# Patient Record
Sex: Female | Born: 1935 | Race: White | Hispanic: No | Smoking: Never smoker
Health system: Southern US, Community
[De-identification: ages and names within clinical notes are randomized; demographics above are authoritative.]

## PROBLEM LIST (undated history)

## (undated) DIAGNOSIS — R011 Cardiac murmur, unspecified: Secondary | ICD-10-CM

## (undated) DIAGNOSIS — I10 Essential (primary) hypertension: Secondary | ICD-10-CM

## (undated) DIAGNOSIS — N183 Chronic kidney disease, stage 3 unspecified: Secondary | ICD-10-CM

## (undated) DIAGNOSIS — I5032 Chronic diastolic (congestive) heart failure: Secondary | ICD-10-CM

## (undated) DIAGNOSIS — Z952 Presence of prosthetic heart valve: Secondary | ICD-10-CM

## (undated) DIAGNOSIS — Z9289 Personal history of other medical treatment: Secondary | ICD-10-CM

## (undated) DIAGNOSIS — M419 Scoliosis, unspecified: Secondary | ICD-10-CM

## (undated) DIAGNOSIS — M1990 Unspecified osteoarthritis, unspecified site: Secondary | ICD-10-CM

## (undated) DIAGNOSIS — E785 Hyperlipidemia, unspecified: Secondary | ICD-10-CM

## (undated) DIAGNOSIS — I350 Nonrheumatic aortic (valve) stenosis: Secondary | ICD-10-CM

## (undated) HISTORY — DX: Chronic diastolic (congestive) heart failure: I50.32

## (undated) HISTORY — DX: Nonrheumatic aortic (valve) stenosis: I35.0

---

## 1966-06-21 HISTORY — PX: DILATION AND CURETTAGE OF UTERUS: SHX78

## 1998-04-14 ENCOUNTER — Other Ambulatory Visit: Admission: RE | Admit: 1998-04-14 | Discharge: 1998-04-14 | Payer: Self-pay | Admitting: *Deleted

## 1999-05-11 ENCOUNTER — Other Ambulatory Visit: Admission: RE | Admit: 1999-05-11 | Discharge: 1999-05-11 | Payer: Self-pay | Admitting: *Deleted

## 1999-11-23 ENCOUNTER — Encounter: Admission: RE | Admit: 1999-11-23 | Discharge: 1999-11-23 | Payer: Self-pay | Admitting: *Deleted

## 1999-11-23 ENCOUNTER — Encounter: Payer: Self-pay | Admitting: *Deleted

## 2000-05-19 ENCOUNTER — Other Ambulatory Visit: Admission: RE | Admit: 2000-05-19 | Discharge: 2000-05-19 | Payer: Self-pay | Admitting: *Deleted

## 2000-11-25 ENCOUNTER — Encounter: Payer: Self-pay | Admitting: *Deleted

## 2000-11-25 ENCOUNTER — Encounter: Admission: RE | Admit: 2000-11-25 | Discharge: 2000-11-25 | Payer: Self-pay | Admitting: *Deleted

## 2002-01-19 ENCOUNTER — Encounter: Admission: RE | Admit: 2002-01-19 | Discharge: 2002-01-19 | Payer: Self-pay | Admitting: *Deleted

## 2002-01-19 ENCOUNTER — Encounter: Payer: Self-pay | Admitting: *Deleted

## 2002-04-25 ENCOUNTER — Other Ambulatory Visit: Admission: RE | Admit: 2002-04-25 | Discharge: 2002-04-25 | Payer: Self-pay | Admitting: Family Medicine

## 2003-02-08 ENCOUNTER — Encounter: Admission: RE | Admit: 2003-02-08 | Discharge: 2003-02-08 | Payer: Self-pay | Admitting: Family Medicine

## 2003-02-08 ENCOUNTER — Encounter: Payer: Self-pay | Admitting: Family Medicine

## 2003-03-01 ENCOUNTER — Encounter: Admission: RE | Admit: 2003-03-01 | Discharge: 2003-03-01 | Payer: Self-pay | Admitting: Family Medicine

## 2003-03-01 ENCOUNTER — Encounter: Payer: Self-pay | Admitting: Family Medicine

## 2003-05-22 ENCOUNTER — Encounter: Admission: RE | Admit: 2003-05-22 | Discharge: 2003-05-22 | Payer: Self-pay | Admitting: Family Medicine

## 2004-03-12 ENCOUNTER — Encounter: Admission: RE | Admit: 2004-03-12 | Discharge: 2004-03-12 | Payer: Self-pay | Admitting: Family Medicine

## 2005-04-15 ENCOUNTER — Encounter: Admission: RE | Admit: 2005-04-15 | Discharge: 2005-04-15 | Payer: Self-pay | Admitting: Family Medicine

## 2006-01-06 ENCOUNTER — Encounter: Admission: RE | Admit: 2006-01-06 | Discharge: 2006-01-06 | Payer: Self-pay | Admitting: Family Medicine

## 2006-04-20 ENCOUNTER — Encounter: Admission: RE | Admit: 2006-04-20 | Discharge: 2006-04-20 | Payer: Self-pay | Admitting: Family Medicine

## 2007-04-26 ENCOUNTER — Encounter: Admission: RE | Admit: 2007-04-26 | Discharge: 2007-04-26 | Payer: Self-pay | Admitting: Family Medicine

## 2007-07-20 ENCOUNTER — Encounter: Admission: RE | Admit: 2007-07-20 | Discharge: 2007-07-20 | Payer: Self-pay | Admitting: Family Medicine

## 2007-09-03 ENCOUNTER — Emergency Department (HOSPITAL_COMMUNITY): Admission: EM | Admit: 2007-09-03 | Discharge: 2007-09-03 | Payer: Self-pay | Admitting: Emergency Medicine

## 2007-11-08 ENCOUNTER — Encounter: Admission: RE | Admit: 2007-11-08 | Discharge: 2007-11-08 | Payer: Self-pay | Admitting: Orthopedic Surgery

## 2008-04-29 ENCOUNTER — Encounter: Admission: RE | Admit: 2008-04-29 | Discharge: 2008-04-29 | Payer: Self-pay | Admitting: Family Medicine

## 2008-05-03 ENCOUNTER — Encounter: Admission: RE | Admit: 2008-05-03 | Discharge: 2008-05-03 | Payer: Self-pay | Admitting: Family Medicine

## 2009-05-05 ENCOUNTER — Encounter: Admission: RE | Admit: 2009-05-05 | Discharge: 2009-05-05 | Payer: Self-pay | Admitting: Family Medicine

## 2010-07-11 ENCOUNTER — Encounter: Payer: Self-pay | Admitting: Family Medicine

## 2010-07-11 ENCOUNTER — Other Ambulatory Visit: Payer: Self-pay | Admitting: Family Medicine

## 2010-07-11 DIAGNOSIS — Z1239 Encounter for other screening for malignant neoplasm of breast: Secondary | ICD-10-CM

## 2010-07-11 DIAGNOSIS — Z1231 Encounter for screening mammogram for malignant neoplasm of breast: Secondary | ICD-10-CM

## 2010-07-12 ENCOUNTER — Encounter: Payer: Self-pay | Admitting: Family Medicine

## 2010-07-29 ENCOUNTER — Ambulatory Visit
Admission: RE | Admit: 2010-07-29 | Discharge: 2010-07-29 | Disposition: A | Discharge status: Home / Self Care | Payer: Medicare Other | Source: Ambulatory Visit | Attending: Family Medicine | Admitting: Family Medicine

## 2010-07-29 DIAGNOSIS — Z1231 Encounter for screening mammogram for malignant neoplasm of breast: Secondary | ICD-10-CM

## 2011-08-04 ENCOUNTER — Other Ambulatory Visit: Payer: Self-pay | Admitting: Family Medicine

## 2011-08-04 DIAGNOSIS — Z1231 Encounter for screening mammogram for malignant neoplasm of breast: Secondary | ICD-10-CM

## 2011-08-12 ENCOUNTER — Ambulatory Visit
Admission: RE | Admit: 2011-08-12 | Discharge: 2011-08-12 | Disposition: A | Discharge status: Home / Self Care | Payer: BLUE CROSS/BLUE SHIELD | Source: Ambulatory Visit | Attending: Family Medicine | Admitting: Family Medicine

## 2011-08-12 DIAGNOSIS — Z1231 Encounter for screening mammogram for malignant neoplasm of breast: Secondary | ICD-10-CM | POA: Diagnosis not present

## 2011-10-12 DIAGNOSIS — M1990 Unspecified osteoarthritis, unspecified site: Secondary | ICD-10-CM | POA: Diagnosis not present

## 2011-10-12 DIAGNOSIS — E782 Mixed hyperlipidemia: Secondary | ICD-10-CM | POA: Diagnosis not present

## 2011-10-12 DIAGNOSIS — E119 Type 2 diabetes mellitus without complications: Secondary | ICD-10-CM | POA: Diagnosis not present

## 2011-10-12 DIAGNOSIS — I10 Essential (primary) hypertension: Secondary | ICD-10-CM | POA: Diagnosis not present

## 2011-10-12 DIAGNOSIS — I359 Nonrheumatic aortic valve disorder, unspecified: Secondary | ICD-10-CM | POA: Diagnosis not present

## 2011-10-12 DIAGNOSIS — F411 Generalized anxiety disorder: Secondary | ICD-10-CM | POA: Diagnosis not present

## 2012-02-09 DIAGNOSIS — H04129 Dry eye syndrome of unspecified lacrimal gland: Secondary | ICD-10-CM | POA: Diagnosis not present

## 2012-03-14 DIAGNOSIS — Z23 Encounter for immunization: Secondary | ICD-10-CM | POA: Diagnosis not present

## 2012-04-18 DIAGNOSIS — Z1331 Encounter for screening for depression: Secondary | ICD-10-CM | POA: Diagnosis not present

## 2012-04-18 DIAGNOSIS — E782 Mixed hyperlipidemia: Secondary | ICD-10-CM | POA: Diagnosis not present

## 2012-04-18 DIAGNOSIS — I10 Essential (primary) hypertension: Secondary | ICD-10-CM | POA: Diagnosis not present

## 2012-04-18 DIAGNOSIS — E119 Type 2 diabetes mellitus without complications: Secondary | ICD-10-CM | POA: Diagnosis not present

## 2012-04-18 DIAGNOSIS — F411 Generalized anxiety disorder: Secondary | ICD-10-CM | POA: Diagnosis not present

## 2012-04-18 DIAGNOSIS — M1990 Unspecified osteoarthritis, unspecified site: Secondary | ICD-10-CM | POA: Diagnosis not present

## 2012-04-18 DIAGNOSIS — I359 Nonrheumatic aortic valve disorder, unspecified: Secondary | ICD-10-CM | POA: Diagnosis not present

## 2012-09-28 ENCOUNTER — Other Ambulatory Visit: Payer: Self-pay

## 2012-09-28 DIAGNOSIS — Z1231 Encounter for screening mammogram for malignant neoplasm of breast: Secondary | ICD-10-CM

## 2012-10-17 DIAGNOSIS — M899 Disorder of bone, unspecified: Secondary | ICD-10-CM | POA: Diagnosis not present

## 2012-10-17 DIAGNOSIS — F411 Generalized anxiety disorder: Secondary | ICD-10-CM | POA: Diagnosis not present

## 2012-10-17 DIAGNOSIS — I129 Hypertensive chronic kidney disease with stage 1 through stage 4 chronic kidney disease, or unspecified chronic kidney disease: Secondary | ICD-10-CM | POA: Diagnosis not present

## 2012-10-17 DIAGNOSIS — M1990 Unspecified osteoarthritis, unspecified site: Secondary | ICD-10-CM | POA: Diagnosis not present

## 2012-10-17 DIAGNOSIS — I359 Nonrheumatic aortic valve disorder, unspecified: Secondary | ICD-10-CM | POA: Diagnosis not present

## 2012-10-17 DIAGNOSIS — E782 Mixed hyperlipidemia: Secondary | ICD-10-CM | POA: Diagnosis not present

## 2012-10-17 DIAGNOSIS — E1129 Type 2 diabetes mellitus with other diabetic kidney complication: Secondary | ICD-10-CM | POA: Diagnosis not present

## 2012-11-01 ENCOUNTER — Ambulatory Visit: Payer: Medicare Other

## 2012-12-13 ENCOUNTER — Ambulatory Visit
Admission: RE | Admit: 2012-12-13 | Discharge: 2012-12-13 | Disposition: A | Discharge status: Home / Self Care | Payer: Medicare Other | Source: Ambulatory Visit

## 2012-12-13 DIAGNOSIS — Z1231 Encounter for screening mammogram for malignant neoplasm of breast: Secondary | ICD-10-CM | POA: Diagnosis not present

## 2012-12-13 DIAGNOSIS — M949 Disorder of cartilage, unspecified: Secondary | ICD-10-CM | POA: Diagnosis not present

## 2013-02-07 DIAGNOSIS — H5200 Hypermetropia, unspecified eye: Secondary | ICD-10-CM | POA: Diagnosis not present

## 2013-02-07 DIAGNOSIS — H40019 Open angle with borderline findings, low risk, unspecified eye: Secondary | ICD-10-CM | POA: Diagnosis not present

## 2013-02-07 DIAGNOSIS — H353190 Nonexudative age-related macular degeneration, unspecified eye, stage unspecified: Secondary | ICD-10-CM | POA: Diagnosis not present

## 2013-02-07 DIAGNOSIS — H26499 Other secondary cataract, unspecified eye: Secondary | ICD-10-CM | POA: Diagnosis not present

## 2013-02-15 DIAGNOSIS — H26499 Other secondary cataract, unspecified eye: Secondary | ICD-10-CM | POA: Diagnosis not present

## 2013-02-15 DIAGNOSIS — Z961 Presence of intraocular lens: Secondary | ICD-10-CM | POA: Diagnosis not present

## 2013-02-21 DIAGNOSIS — H04129 Dry eye syndrome of unspecified lacrimal gland: Secondary | ICD-10-CM | POA: Diagnosis not present

## 2013-02-21 DIAGNOSIS — H5200 Hypermetropia, unspecified eye: Secondary | ICD-10-CM | POA: Diagnosis not present

## 2013-02-21 DIAGNOSIS — H353190 Nonexudative age-related macular degeneration, unspecified eye, stage unspecified: Secondary | ICD-10-CM | POA: Diagnosis not present

## 2013-03-06 DIAGNOSIS — Z23 Encounter for immunization: Secondary | ICD-10-CM | POA: Diagnosis not present

## 2013-04-18 DIAGNOSIS — I359 Nonrheumatic aortic valve disorder, unspecified: Secondary | ICD-10-CM | POA: Diagnosis not present

## 2013-04-18 DIAGNOSIS — E1129 Type 2 diabetes mellitus with other diabetic kidney complication: Secondary | ICD-10-CM | POA: Diagnosis not present

## 2013-04-18 DIAGNOSIS — E782 Mixed hyperlipidemia: Secondary | ICD-10-CM | POA: Diagnosis not present

## 2013-04-18 DIAGNOSIS — F411 Generalized anxiety disorder: Secondary | ICD-10-CM | POA: Diagnosis not present

## 2013-04-18 DIAGNOSIS — M1990 Unspecified osteoarthritis, unspecified site: Secondary | ICD-10-CM | POA: Diagnosis not present

## 2013-04-18 DIAGNOSIS — I10 Essential (primary) hypertension: Secondary | ICD-10-CM | POA: Diagnosis not present

## 2013-04-18 DIAGNOSIS — Z23 Encounter for immunization: Secondary | ICD-10-CM | POA: Diagnosis not present

## 2014-02-12 ENCOUNTER — Other Ambulatory Visit: Payer: Self-pay

## 2014-02-12 DIAGNOSIS — Z1231 Encounter for screening mammogram for malignant neoplasm of breast: Secondary | ICD-10-CM

## 2014-02-27 ENCOUNTER — Ambulatory Visit
Admission: RE | Admit: 2014-02-27 | Discharge: 2014-02-27 | Disposition: A | Discharge status: Home / Self Care | Payer: Medicare Other | Source: Ambulatory Visit

## 2014-02-27 DIAGNOSIS — Z1231 Encounter for screening mammogram for malignant neoplasm of breast: Secondary | ICD-10-CM | POA: Diagnosis not present

## 2014-03-07 DIAGNOSIS — Z961 Presence of intraocular lens: Secondary | ICD-10-CM | POA: Diagnosis not present

## 2014-03-07 DIAGNOSIS — H52229 Regular astigmatism, unspecified eye: Secondary | ICD-10-CM | POA: Diagnosis not present

## 2014-03-07 DIAGNOSIS — H18419 Arcus senilis, unspecified eye: Secondary | ICD-10-CM | POA: Diagnosis not present

## 2014-03-07 DIAGNOSIS — H11159 Pinguecula, unspecified eye: Secondary | ICD-10-CM | POA: Diagnosis not present

## 2014-03-07 DIAGNOSIS — H5200 Hypermetropia, unspecified eye: Secondary | ICD-10-CM | POA: Diagnosis not present

## 2014-03-07 DIAGNOSIS — H18519 Endothelial corneal dystrophy, unspecified eye: Secondary | ICD-10-CM | POA: Diagnosis not present

## 2014-03-07 DIAGNOSIS — H353190 Nonexudative age-related macular degeneration, unspecified eye, stage unspecified: Secondary | ICD-10-CM | POA: Diagnosis not present

## 2014-03-07 DIAGNOSIS — H04129 Dry eye syndrome of unspecified lacrimal gland: Secondary | ICD-10-CM | POA: Diagnosis not present

## 2014-03-18 DIAGNOSIS — Z23 Encounter for immunization: Secondary | ICD-10-CM | POA: Diagnosis not present

## 2014-04-17 DIAGNOSIS — E1129 Type 2 diabetes mellitus with other diabetic kidney complication: Secondary | ICD-10-CM | POA: Diagnosis not present

## 2014-04-17 DIAGNOSIS — F39 Unspecified mood [affective] disorder: Secondary | ICD-10-CM | POA: Diagnosis not present

## 2014-04-17 DIAGNOSIS — I350 Nonrheumatic aortic (valve) stenosis: Secondary | ICD-10-CM | POA: Diagnosis not present

## 2014-04-17 DIAGNOSIS — I110 Hypertensive heart disease with heart failure: Secondary | ICD-10-CM | POA: Diagnosis not present

## 2014-04-17 DIAGNOSIS — Z1389 Encounter for screening for other disorder: Secondary | ICD-10-CM | POA: Diagnosis not present

## 2014-04-17 DIAGNOSIS — I519 Heart disease, unspecified: Secondary | ICD-10-CM | POA: Diagnosis not present

## 2014-04-17 DIAGNOSIS — I700 Atherosclerosis of aorta: Secondary | ICD-10-CM | POA: Diagnosis not present

## 2014-04-17 DIAGNOSIS — N183 Chronic kidney disease, stage 3 (moderate): Secondary | ICD-10-CM | POA: Diagnosis not present

## 2014-04-17 DIAGNOSIS — M1990 Unspecified osteoarthritis, unspecified site: Secondary | ICD-10-CM | POA: Diagnosis not present

## 2015-02-12 ENCOUNTER — Other Ambulatory Visit: Payer: Self-pay

## 2015-02-12 DIAGNOSIS — Z1231 Encounter for screening mammogram for malignant neoplasm of breast: Secondary | ICD-10-CM

## 2015-02-19 DIAGNOSIS — H1851 Endothelial corneal dystrophy: Secondary | ICD-10-CM | POA: Diagnosis not present

## 2015-02-19 DIAGNOSIS — Z961 Presence of intraocular lens: Secondary | ICD-10-CM | POA: Diagnosis not present

## 2015-02-19 DIAGNOSIS — H3531 Nonexudative age-related macular degeneration: Secondary | ICD-10-CM | POA: Diagnosis not present

## 2015-02-19 DIAGNOSIS — H43812 Vitreous degeneration, left eye: Secondary | ICD-10-CM | POA: Diagnosis not present

## 2015-02-19 DIAGNOSIS — H52223 Regular astigmatism, bilateral: Secondary | ICD-10-CM | POA: Diagnosis not present

## 2015-02-19 DIAGNOSIS — H04123 Dry eye syndrome of bilateral lacrimal glands: Secondary | ICD-10-CM | POA: Diagnosis not present

## 2015-02-19 DIAGNOSIS — H35361 Drusen (degenerative) of macula, right eye: Secondary | ICD-10-CM | POA: Diagnosis not present

## 2015-02-19 DIAGNOSIS — H5203 Hypermetropia, bilateral: Secondary | ICD-10-CM | POA: Diagnosis not present

## 2015-02-19 DIAGNOSIS — H35362 Drusen (degenerative) of macula, left eye: Secondary | ICD-10-CM | POA: Diagnosis not present

## 2015-02-19 DIAGNOSIS — H43811 Vitreous degeneration, right eye: Secondary | ICD-10-CM | POA: Diagnosis not present

## 2015-02-19 DIAGNOSIS — H11153 Pinguecula, bilateral: Secondary | ICD-10-CM | POA: Diagnosis not present

## 2015-02-19 DIAGNOSIS — H18413 Arcus senilis, bilateral: Secondary | ICD-10-CM | POA: Diagnosis not present

## 2015-03-19 ENCOUNTER — Ambulatory Visit
Admission: RE | Admit: 2015-03-19 | Discharge: 2015-03-19 | Disposition: A | Discharge status: Home / Self Care | Payer: Medicare Other | Source: Ambulatory Visit

## 2015-03-19 DIAGNOSIS — Z1231 Encounter for screening mammogram for malignant neoplasm of breast: Secondary | ICD-10-CM

## 2015-03-24 DIAGNOSIS — Z23 Encounter for immunization: Secondary | ICD-10-CM | POA: Diagnosis not present

## 2015-05-07 DIAGNOSIS — M1990 Unspecified osteoarthritis, unspecified site: Secondary | ICD-10-CM | POA: Diagnosis not present

## 2015-05-07 DIAGNOSIS — N183 Chronic kidney disease, stage 3 (moderate): Secondary | ICD-10-CM | POA: Diagnosis not present

## 2015-05-07 DIAGNOSIS — I350 Nonrheumatic aortic (valve) stenosis: Secondary | ICD-10-CM | POA: Diagnosis not present

## 2015-05-07 DIAGNOSIS — M8580 Other specified disorders of bone density and structure, unspecified site: Secondary | ICD-10-CM | POA: Diagnosis not present

## 2015-05-07 DIAGNOSIS — E1129 Type 2 diabetes mellitus with other diabetic kidney complication: Secondary | ICD-10-CM | POA: Diagnosis not present

## 2015-05-07 DIAGNOSIS — I519 Heart disease, unspecified: Secondary | ICD-10-CM | POA: Diagnosis not present

## 2015-05-07 DIAGNOSIS — E782 Mixed hyperlipidemia: Secondary | ICD-10-CM | POA: Diagnosis not present

## 2015-05-07 DIAGNOSIS — M549 Dorsalgia, unspecified: Secondary | ICD-10-CM | POA: Diagnosis not present

## 2015-05-07 DIAGNOSIS — I110 Hypertensive heart disease with heart failure: Secondary | ICD-10-CM | POA: Diagnosis not present

## 2015-05-07 DIAGNOSIS — F39 Unspecified mood [affective] disorder: Secondary | ICD-10-CM | POA: Diagnosis not present

## 2015-05-07 DIAGNOSIS — Z0000 Encounter for general adult medical examination without abnormal findings: Secondary | ICD-10-CM | POA: Diagnosis not present

## 2015-05-07 DIAGNOSIS — I700 Atherosclerosis of aorta: Secondary | ICD-10-CM | POA: Diagnosis not present

## 2015-05-08 ENCOUNTER — Other Ambulatory Visit: Payer: Self-pay | Admitting: Family Medicine

## 2015-05-08 DIAGNOSIS — M549 Dorsalgia, unspecified: Secondary | ICD-10-CM

## 2015-05-26 ENCOUNTER — Ambulatory Visit
Admission: RE | Admit: 2015-05-26 | Discharge: 2015-05-26 | Disposition: A | Discharge status: Home / Self Care | Payer: Medicare Other | Source: Ambulatory Visit | Attending: Family Medicine | Admitting: Family Medicine

## 2015-05-26 DIAGNOSIS — M4806 Spinal stenosis, lumbar region: Secondary | ICD-10-CM | POA: Diagnosis not present

## 2015-05-26 DIAGNOSIS — M549 Dorsalgia, unspecified: Secondary | ICD-10-CM

## 2015-06-12 DIAGNOSIS — M47817 Spondylosis without myelopathy or radiculopathy, lumbosacral region: Secondary | ICD-10-CM | POA: Diagnosis not present

## 2015-06-12 DIAGNOSIS — M5416 Radiculopathy, lumbar region: Secondary | ICD-10-CM | POA: Diagnosis not present

## 2015-06-12 DIAGNOSIS — I10 Essential (primary) hypertension: Secondary | ICD-10-CM | POA: Diagnosis not present

## 2015-06-12 DIAGNOSIS — Z6825 Body mass index (BMI) 25.0-25.9, adult: Secondary | ICD-10-CM | POA: Diagnosis not present

## 2015-06-12 DIAGNOSIS — M4806 Spinal stenosis, lumbar region: Secondary | ICD-10-CM | POA: Diagnosis not present

## 2015-06-17 DIAGNOSIS — M4806 Spinal stenosis, lumbar region: Secondary | ICD-10-CM | POA: Diagnosis not present

## 2015-06-17 DIAGNOSIS — Z6825 Body mass index (BMI) 25.0-25.9, adult: Secondary | ICD-10-CM | POA: Diagnosis not present

## 2015-06-17 DIAGNOSIS — M5416 Radiculopathy, lumbar region: Secondary | ICD-10-CM | POA: Diagnosis not present

## 2015-07-02 DIAGNOSIS — M5416 Radiculopathy, lumbar region: Secondary | ICD-10-CM | POA: Diagnosis not present

## 2015-07-02 DIAGNOSIS — I10 Essential (primary) hypertension: Secondary | ICD-10-CM | POA: Diagnosis not present

## 2015-07-02 DIAGNOSIS — M4806 Spinal stenosis, lumbar region: Secondary | ICD-10-CM | POA: Diagnosis not present

## 2015-07-02 DIAGNOSIS — Z6825 Body mass index (BMI) 25.0-25.9, adult: Secondary | ICD-10-CM | POA: Diagnosis not present

## 2015-08-08 DIAGNOSIS — E782 Mixed hyperlipidemia: Secondary | ICD-10-CM | POA: Diagnosis not present

## 2015-10-29 DIAGNOSIS — I700 Atherosclerosis of aorta: Secondary | ICD-10-CM | POA: Diagnosis not present

## 2015-10-29 DIAGNOSIS — E782 Mixed hyperlipidemia: Secondary | ICD-10-CM | POA: Diagnosis not present

## 2015-10-29 DIAGNOSIS — F39 Unspecified mood [affective] disorder: Secondary | ICD-10-CM | POA: Diagnosis not present

## 2015-10-29 DIAGNOSIS — I110 Hypertensive heart disease with heart failure: Secondary | ICD-10-CM | POA: Diagnosis not present

## 2015-10-29 DIAGNOSIS — N183 Chronic kidney disease, stage 3 (moderate): Secondary | ICD-10-CM | POA: Diagnosis not present

## 2015-10-29 DIAGNOSIS — I519 Heart disease, unspecified: Secondary | ICD-10-CM | POA: Diagnosis not present

## 2015-10-29 DIAGNOSIS — E1129 Type 2 diabetes mellitus with other diabetic kidney complication: Secondary | ICD-10-CM | POA: Diagnosis not present

## 2016-03-04 DIAGNOSIS — H353190 Nonexudative age-related macular degeneration, unspecified eye, stage unspecified: Secondary | ICD-10-CM | POA: Diagnosis not present

## 2016-03-08 DIAGNOSIS — Z23 Encounter for immunization: Secondary | ICD-10-CM | POA: Diagnosis not present

## 2016-08-20 DIAGNOSIS — I519 Heart disease, unspecified: Secondary | ICD-10-CM | POA: Diagnosis not present

## 2016-08-20 DIAGNOSIS — Z0000 Encounter for general adult medical examination without abnormal findings: Secondary | ICD-10-CM | POA: Diagnosis not present

## 2016-08-20 DIAGNOSIS — M1990 Unspecified osteoarthritis, unspecified site: Secondary | ICD-10-CM | POA: Diagnosis not present

## 2016-08-20 DIAGNOSIS — E1129 Type 2 diabetes mellitus with other diabetic kidney complication: Secondary | ICD-10-CM | POA: Diagnosis not present

## 2016-08-20 DIAGNOSIS — E782 Mixed hyperlipidemia: Secondary | ICD-10-CM | POA: Diagnosis not present

## 2016-08-20 DIAGNOSIS — M8580 Other specified disorders of bone density and structure, unspecified site: Secondary | ICD-10-CM | POA: Diagnosis not present

## 2016-08-20 DIAGNOSIS — M549 Dorsalgia, unspecified: Secondary | ICD-10-CM | POA: Diagnosis not present

## 2016-08-20 DIAGNOSIS — I700 Atherosclerosis of aorta: Secondary | ICD-10-CM | POA: Diagnosis not present

## 2016-08-20 DIAGNOSIS — J301 Allergic rhinitis due to pollen: Secondary | ICD-10-CM | POA: Diagnosis not present

## 2016-08-20 DIAGNOSIS — N183 Chronic kidney disease, stage 3 (moderate): Secondary | ICD-10-CM | POA: Diagnosis not present

## 2016-08-20 DIAGNOSIS — I110 Hypertensive heart disease with heart failure: Secondary | ICD-10-CM | POA: Diagnosis not present

## 2017-03-10 DIAGNOSIS — Z23 Encounter for immunization: Secondary | ICD-10-CM | POA: Diagnosis not present

## 2017-03-15 DIAGNOSIS — H26492 Other secondary cataract, left eye: Secondary | ICD-10-CM | POA: Diagnosis not present

## 2017-09-01 DIAGNOSIS — I700 Atherosclerosis of aorta: Secondary | ICD-10-CM | POA: Diagnosis not present

## 2017-09-01 DIAGNOSIS — E1129 Type 2 diabetes mellitus with other diabetic kidney complication: Secondary | ICD-10-CM | POA: Diagnosis not present

## 2017-09-01 DIAGNOSIS — M549 Dorsalgia, unspecified: Secondary | ICD-10-CM | POA: Diagnosis not present

## 2017-09-01 DIAGNOSIS — M1990 Unspecified osteoarthritis, unspecified site: Secondary | ICD-10-CM | POA: Diagnosis not present

## 2017-09-01 DIAGNOSIS — J301 Allergic rhinitis due to pollen: Secondary | ICD-10-CM | POA: Diagnosis not present

## 2017-09-01 DIAGNOSIS — I519 Heart disease, unspecified: Secondary | ICD-10-CM | POA: Diagnosis not present

## 2017-09-01 DIAGNOSIS — E782 Mixed hyperlipidemia: Secondary | ICD-10-CM | POA: Diagnosis not present

## 2017-09-01 DIAGNOSIS — N183 Chronic kidney disease, stage 3 (moderate): Secondary | ICD-10-CM | POA: Diagnosis not present

## 2017-09-01 DIAGNOSIS — M8580 Other specified disorders of bone density and structure, unspecified site: Secondary | ICD-10-CM | POA: Diagnosis not present

## 2017-09-01 DIAGNOSIS — Z0000 Encounter for general adult medical examination without abnormal findings: Secondary | ICD-10-CM | POA: Diagnosis not present

## 2017-09-01 DIAGNOSIS — I110 Hypertensive heart disease with heart failure: Secondary | ICD-10-CM | POA: Diagnosis not present

## 2017-11-17 DIAGNOSIS — S60222A Contusion of left hand, initial encounter: Secondary | ICD-10-CM | POA: Diagnosis not present

## 2018-03-21 DIAGNOSIS — Z23 Encounter for immunization: Secondary | ICD-10-CM | POA: Diagnosis not present

## 2018-03-31 DIAGNOSIS — H353131 Nonexudative age-related macular degeneration, bilateral, early dry stage: Secondary | ICD-10-CM | POA: Diagnosis not present

## 2018-04-24 DIAGNOSIS — L309 Dermatitis, unspecified: Secondary | ICD-10-CM | POA: Diagnosis not present

## 2018-04-28 DIAGNOSIS — L89309 Pressure ulcer of unspecified buttock, unspecified stage: Secondary | ICD-10-CM | POA: Diagnosis not present

## 2018-06-16 ENCOUNTER — Inpatient Hospital Stay
Admission: EM | Admit: 2018-06-16 | Discharge: 2018-06-20 | DRG: 291 | Disposition: A | Discharge status: Home Health | Payer: Medicare Other | Attending: Internal Medicine | Admitting: Internal Medicine

## 2018-06-16 ENCOUNTER — Emergency Department: Payer: Medicare Other

## 2018-06-16 ENCOUNTER — Other Ambulatory Visit: Payer: Self-pay

## 2018-06-16 ENCOUNTER — Encounter: Payer: Self-pay | Admitting: Internal Medicine

## 2018-06-16 DIAGNOSIS — Z833 Family history of diabetes mellitus: Secondary | ICD-10-CM | POA: Diagnosis not present

## 2018-06-16 DIAGNOSIS — R739 Hyperglycemia, unspecified: Secondary | ICD-10-CM | POA: Diagnosis present

## 2018-06-16 DIAGNOSIS — E860 Dehydration: Secondary | ICD-10-CM | POA: Diagnosis present

## 2018-06-16 DIAGNOSIS — I5031 Acute diastolic (congestive) heart failure: Secondary | ICD-10-CM | POA: Diagnosis present

## 2018-06-16 DIAGNOSIS — J811 Chronic pulmonary edema: Secondary | ICD-10-CM | POA: Diagnosis not present

## 2018-06-16 DIAGNOSIS — I509 Heart failure, unspecified: Secondary | ICD-10-CM

## 2018-06-16 DIAGNOSIS — E785 Hyperlipidemia, unspecified: Secondary | ICD-10-CM | POA: Diagnosis present

## 2018-06-16 DIAGNOSIS — Z8249 Family history of ischemic heart disease and other diseases of the circulatory system: Secondary | ICD-10-CM | POA: Diagnosis not present

## 2018-06-16 DIAGNOSIS — N179 Acute kidney failure, unspecified: Secondary | ICD-10-CM | POA: Diagnosis present

## 2018-06-16 DIAGNOSIS — I248 Other forms of acute ischemic heart disease: Secondary | ICD-10-CM | POA: Diagnosis present

## 2018-06-16 DIAGNOSIS — D649 Anemia, unspecified: Secondary | ICD-10-CM | POA: Diagnosis present

## 2018-06-16 DIAGNOSIS — I350 Nonrheumatic aortic (valve) stenosis: Secondary | ICD-10-CM | POA: Diagnosis present

## 2018-06-16 DIAGNOSIS — E872 Acidosis: Secondary | ICD-10-CM | POA: Diagnosis not present

## 2018-06-16 DIAGNOSIS — Z825 Family history of asthma and other chronic lower respiratory diseases: Secondary | ICD-10-CM

## 2018-06-16 DIAGNOSIS — J9601 Acute respiratory failure with hypoxia: Secondary | ICD-10-CM | POA: Diagnosis present

## 2018-06-16 DIAGNOSIS — Z66 Do not resuscitate: Secondary | ICD-10-CM | POA: Diagnosis present

## 2018-06-16 DIAGNOSIS — I110 Hypertensive heart disease with heart failure: Principal | ICD-10-CM | POA: Diagnosis present

## 2018-06-16 DIAGNOSIS — J810 Acute pulmonary edema: Secondary | ICD-10-CM | POA: Diagnosis present

## 2018-06-16 DIAGNOSIS — R0602 Shortness of breath: Secondary | ICD-10-CM | POA: Diagnosis not present

## 2018-06-16 DIAGNOSIS — D72829 Elevated white blood cell count, unspecified: Secondary | ICD-10-CM | POA: Diagnosis present

## 2018-06-16 DIAGNOSIS — I501 Left ventricular failure: Secondary | ICD-10-CM | POA: Diagnosis not present

## 2018-06-16 DIAGNOSIS — Z79899 Other long term (current) drug therapy: Secondary | ICD-10-CM | POA: Diagnosis not present

## 2018-06-16 DIAGNOSIS — T501X5A Adverse effect of loop [high-ceiling] diuretics, initial encounter: Secondary | ICD-10-CM | POA: Diagnosis present

## 2018-06-16 DIAGNOSIS — R0982 Postnasal drip: Secondary | ICD-10-CM | POA: Diagnosis not present

## 2018-06-16 DIAGNOSIS — I10 Essential (primary) hypertension: Secondary | ICD-10-CM | POA: Diagnosis not present

## 2018-06-16 DIAGNOSIS — R0902 Hypoxemia: Secondary | ICD-10-CM | POA: Diagnosis not present

## 2018-06-16 HISTORY — DX: Hyperlipidemia, unspecified: E78.5

## 2018-06-16 HISTORY — DX: Essential (primary) hypertension: I10

## 2018-06-16 LAB — CBC WITH DIFFERENTIAL/PLATELET
ABS IMMATURE GRANULOCYTES: 0.06 10*3/uL (ref 0.00–0.07)
BASOS PCT: 2 %
Basophils Absolute: 0.2 10*3/uL — ABNORMAL HIGH (ref 0.0–0.1)
Eosinophils Absolute: 0.2 10*3/uL (ref 0.0–0.5)
Eosinophils Relative: 2 %
HCT: 36.5 % (ref 36.0–46.0)
Hemoglobin: 11.6 g/dL — ABNORMAL LOW (ref 12.0–15.0)
Immature Granulocytes: 1 %
Lymphocytes Relative: 11 %
Lymphs Abs: 1.3 10*3/uL (ref 0.7–4.0)
MCH: 29.5 pg (ref 26.0–34.0)
MCHC: 31.8 g/dL (ref 30.0–36.0)
MCV: 92.9 fL (ref 80.0–100.0)
Monocytes Absolute: 0.6 10*3/uL (ref 0.1–1.0)
Monocytes Relative: 5 %
NEUTROS ABS: 8.7 10*3/uL — AB (ref 1.7–7.7)
NEUTROS PCT: 79 %
PLATELETS: 349 10*3/uL (ref 150–400)
RBC: 3.93 MIL/uL (ref 3.87–5.11)
RDW: 13.6 % (ref 11.5–15.5)
WBC: 11 10*3/uL — ABNORMAL HIGH (ref 4.0–10.5)
nRBC: 0 % (ref 0.0–0.2)

## 2018-06-16 LAB — TROPONIN I
TROPONIN I: 0.07 ng/mL — AB (ref <0.03)
Troponin I: 0.05 ng/mL (ref <0.03)
Troponin I: 0.13 ng/mL (ref <0.03)

## 2018-06-16 LAB — COMPREHENSIVE METABOLIC PANEL
ALT: 21 U/L (ref 0–44)
AST: 30 U/L (ref 15–41)
Albumin: 3.9 g/dL (ref 3.5–5.0)
Alkaline Phosphatase: 69 U/L (ref 38–126)
Anion gap: 14 (ref 5–15)
BUN: 30 mg/dL — AB (ref 8–23)
CHLORIDE: 102 mmol/L (ref 98–111)
CO2: 22 mmol/L (ref 22–32)
CREATININE: 1.27 mg/dL — AB (ref 0.44–1.00)
Calcium: 9.6 mg/dL (ref 8.9–10.3)
GFR calc non Af Amer: 36 mL/min — ABNORMAL LOW (ref >60)
GFR, EST AFRICAN AMERICAN: 42 mL/min — AB (ref >60)
Glucose, Bld: 191 mg/dL — ABNORMAL HIGH (ref 70–99)
Potassium: 4.7 mmol/L (ref 3.5–5.1)
SODIUM: 138 mmol/L (ref 135–145)
Total Bilirubin: 1.1 mg/dL (ref 0.3–1.2)
Total Protein: 7.6 g/dL (ref 6.5–8.1)

## 2018-06-16 LAB — CG4 I-STAT (LACTIC ACID): Lactic Acid, Venous: 2.44 mmol/L (ref 0.5–1.9)

## 2018-06-16 LAB — HEMOGLOBIN A1C
Hgb A1c MFr Bld: 5.9 % — ABNORMAL HIGH (ref 4.8–5.6)
Mean Plasma Glucose: 122.63 mg/dL

## 2018-06-16 LAB — BRAIN NATRIURETIC PEPTIDE: B NATRIURETIC PEPTIDE 5: 1541 pg/mL — AB (ref 0.0–100.0)

## 2018-06-16 LAB — INFLUENZA PANEL BY PCR (TYPE A & B)
INFLAPCR: NEGATIVE
Influenza B By PCR: NEGATIVE

## 2018-06-16 LAB — LACTIC ACID, PLASMA: Lactic Acid, Venous: 1.6 mmol/L (ref 0.5–1.9)

## 2018-06-16 MED ORDER — SODIUM CHLORIDE 0.9 % IV SOLN: 250.0000 mL | INTRAVENOUS | Status: DC | PRN

## 2018-06-16 MED ORDER — VITAMIN D3 25 MCG (1000 UNIT) PO TABS
1000.0000 [IU] | ORAL_TABLET | Freq: Every day | ORAL | Status: DC
  Administered 2018-06-17 – 2018-06-20 (×4): 1000 [IU] via ORAL
  Filled 2018-06-16 (×5): qty 1

## 2018-06-16 MED ORDER — ACETAMINOPHEN 650 MG RE SUPP: 650.0000 mg | Freq: Four times a day (QID) | RECTAL | Status: DC | PRN

## 2018-06-16 MED ORDER — ADULT MULTIVITAMIN W/MINERALS CH
1.0000 | ORAL_TABLET | Freq: Every day | ORAL | Status: DC
  Administered 2018-06-17 – 2018-06-19 (×3): 1 via ORAL
  Filled 2018-06-16 (×2): qty 1

## 2018-06-16 MED ORDER — CALCIUM CARBONATE 1500 (600 CA) MG PO TABS: 1500.0000 mg | ORAL_TABLET | Freq: Every day | ORAL | Status: DC

## 2018-06-16 MED ORDER — FUROSEMIDE 10 MG/ML IJ SOLN
40.0000 mg | Freq: Once | INTRAMUSCULAR | Status: AC
  Administered 2018-06-16: 40 mg via INTRAVENOUS
  Filled 2018-06-16: qty 4

## 2018-06-16 MED ORDER — POTASSIUM CHLORIDE CRYS ER 20 MEQ PO TBCR
20.0000 meq | EXTENDED_RELEASE_TABLET | Freq: Every day | ORAL | Status: DC
  Administered 2018-06-17 – 2018-06-20 (×4): 20 meq via ORAL
  Filled 2018-06-16 (×4): qty 1

## 2018-06-16 MED ORDER — SODIUM CHLORIDE 0.9% FLUSH
3.0000 mL | INTRAVENOUS | Status: DC | PRN
  Administered 2018-06-18: 3 mL via INTRAVENOUS
  Filled 2018-06-16: qty 3

## 2018-06-16 MED ORDER — HEPARIN SODIUM (PORCINE) 5000 UNIT/ML IJ SOLN
5000.0000 [IU] | Freq: Three times a day (TID) | INTRAMUSCULAR | Status: DC
  Administered 2018-06-16 – 2018-06-20 (×11): 5000 [IU] via SUBCUTANEOUS
  Filled 2018-06-16 (×10): qty 1

## 2018-06-16 MED ORDER — ONDANSETRON HCL 4 MG PO TABS: 4.0000 mg | ORAL_TABLET | Freq: Four times a day (QID) | ORAL | Status: DC | PRN

## 2018-06-16 MED ORDER — ACETAMINOPHEN 325 MG PO TABS: 650.0000 mg | ORAL_TABLET | Freq: Four times a day (QID) | ORAL | Status: DC | PRN

## 2018-06-16 MED ORDER — VITAMIN E 180 MG (400 UNIT) PO CAPS
400.0000 [IU] | ORAL_CAPSULE | Freq: Every day | ORAL | Status: DC
  Administered 2018-06-17 – 2018-06-20 (×4): 400 [IU] via ORAL
  Filled 2018-06-16 (×5): qty 1

## 2018-06-16 MED ORDER — ASPIRIN EC 81 MG PO TBEC
81.0000 mg | DELAYED_RELEASE_TABLET | Freq: Every day | ORAL | Status: DC
  Administered 2018-06-17 – 2018-06-20 (×4): 81 mg via ORAL
  Filled 2018-06-16 (×4): qty 1

## 2018-06-16 MED ORDER — FUROSEMIDE 10 MG/ML IJ SOLN
40.0000 mg | Freq: Two times a day (BID) | INTRAMUSCULAR | Status: DC
  Administered 2018-06-16 – 2018-06-19 (×6): 40 mg via INTRAVENOUS
  Filled 2018-06-16 (×6): qty 4

## 2018-06-16 MED ORDER — VITAMIN C 500 MG PO TABS
250.0000 mg | ORAL_TABLET | Freq: Every day | ORAL | Status: DC
  Administered 2018-06-17 – 2018-06-20 (×4): 250 mg via ORAL
  Filled 2018-06-16 (×4): qty 1

## 2018-06-16 MED ORDER — ONDANSETRON HCL 4 MG/2ML IJ SOLN: 4.0000 mg | Freq: Four times a day (QID) | INTRAMUSCULAR | Status: DC | PRN

## 2018-06-16 MED ORDER — CALCIUM CARBONATE ANTACID 500 MG PO CHEW
3.0000 | CHEWABLE_TABLET | Freq: Every day | ORAL | Status: DC
  Administered 2018-06-17 – 2018-06-20 (×4): 600 mg via ORAL
  Filled 2018-06-16 (×4): qty 3

## 2018-06-16 MED ORDER — IPRATROPIUM-ALBUTEROL 0.5-2.5 (3) MG/3ML IN SOLN
3.0000 mL | Freq: Once | RESPIRATORY_TRACT | Status: AC
  Administered 2018-06-16: 3 mL via RESPIRATORY_TRACT
  Filled 2018-06-16: qty 3

## 2018-06-16 MED ORDER — SODIUM CHLORIDE 0.9% FLUSH
3.0000 mL | Freq: Two times a day (BID) | INTRAVENOUS | Status: DC
  Administered 2018-06-16 – 2018-06-20 (×7): 3 mL via INTRAVENOUS

## 2018-06-16 MED ORDER — SIMVASTATIN 20 MG PO TABS
20.0000 mg | ORAL_TABLET | Freq: Every day | ORAL | Status: DC
  Administered 2018-06-16 – 2018-06-19 (×4): 20 mg via ORAL
  Filled 2018-06-16 (×4): qty 1

## 2018-06-16 MED ORDER — HYDROCODONE-ACETAMINOPHEN 5-325 MG PO TABS: 1.0000 | ORAL_TABLET | Freq: Four times a day (QID) | ORAL | Status: DC | PRN

## 2018-06-16 MED ORDER — ATENOLOL 50 MG PO TABS
50.0000 mg | ORAL_TABLET | Freq: Every day | ORAL | Status: DC
  Administered 2018-06-17 – 2018-06-20 (×4): 50 mg via ORAL
  Filled 2018-06-16 (×5): qty 1

## 2018-06-16 MED ORDER — SENNOSIDES-DOCUSATE SODIUM 8.6-50 MG PO TABS: 1.0000 | ORAL_TABLET | Freq: Every evening | ORAL | Status: DC | PRN

## 2018-06-16 NOTE — Consult Note (Signed)
Cardiology Consultation:   Patient ID: Natalie Sherman; [id number]; 1922-02-06   Admit date: 06/16/2018 Date of Consult: 06/16/2018  Primary Care Provider: Lupita Raider, MD Primary Cardiologist: new to [facility] - consult by Kirke Corin   Patient Profile:   Natalie Sherman is a 84 y.o. female with a hx of murmur, HTN and HLD who is being seen today for the evaluation of CHF at the request of Dr. Tobi Bastos.  History of Present Illness:   Natalie Sherman has no previously known cardiac history. Over the past 1 weeks she has noted an increase in SOB, lower extremity swelling, and orthopnea. These symptoms significantly worsened 2 days prior. She reported to her daughter the week prior that she was more SOB when ambulating around her house. She noted a cough that was initially productive of brown sputum, though more recently has been productive of white sputum. On 12/25, this became significantly worse with associated orthopnea, sleeping sitting up. At baseline, she sleeps with one pillow and the head of the bed completely flat. She has denied any abdominal distension or early satiety. She does note some chest tightness that is improved when sitting up. She indicates she drinks > 2 L of water on a daily basis. She reports a remote echo done through her PCP's office at [facility] in [place] for murmur. She or her daughter do not recall the results of this echo, though the patient has never been seen by cardiology in the past.  Upon the patient's arrival to [facility] they were found to have stable BP and HR with , oxygen saturation 82% on room air requiring supplemental oxygen via nasal cannula, weight not performed in the ED or upon admission. EKG showed NSR, 60 bpm, nonspecific inferolateral st/t changes, CXR showed diffuse interstitial and airspace pulmonary edema and likely small bilateral pleural effusions. Labs showed BNP 1541, troponin 0.05, K+ 4.7, BUN 30, SCr 1.27, albumin 3.9, WBC 11, HGB 11.6.  In the ED, she was given IV Lasix 40 mg and a Duoneb. Upon admission, cardiology was asked to evaluate. Currently, notes slightly improved SOB, though she has been sedentary since her arrival and symptoms are more so exacerbated by exertion. At baseline, she is pretty functional given her age.   Past Medical History:  Diagnosis Date  . Hyperlipidemia   . Hypertension     Past Surgical History:  Procedure Laterality Date  . none       Home Meds: Prior to Admission medications   Medication Sig Start Date End Date Taking? Authorizing Provider  amLODipine (NORVASC) 10 MG tablet Take 1 tablet by mouth daily.   Yes [provider]  atenolol (TENORMIN) 50 MG tablet Take 1 tablet by mouth daily.   Yes [provider]  calcium carbonate (OSCAL) 1500 (600 Ca) MG TABS tablet Take 1,500 mg by mouth daily.   Yes [provider]  cholecalciferol (VITAMIN D3) 25 MCG (1000 UT) tablet Take 1,000 Units by mouth daily.   Yes [provider]  Multiple Vitamin (MULTIVITAMIN WITH MINERALS) TABS tablet Take 1 tablet by mouth daily.   Yes [provider]  Multiple Vitamins-Minerals (PRESERVISION/LUTEIN) CAPS Take 1 capsule by mouth daily.   Yes [provider]  simvastatin (ZOCOR) 20 MG tablet Take 1 tablet by mouth daily.   Yes [provider]  vitamin C (ASCORBIC ACID) 250 MG tablet Take 250 mg by mouth daily.   Yes [provider]  vitamin E 400 UNIT capsule Take  400 Units by mouth daily.   Yes [provider]  HYDROcodone-acetaminophen (NORCO/VICODIN) 5-325 MG tablet Take 1 tablet by mouth every 6 (six) hours as needed.    [provider]  Influenza vac split quadrivalent PF (FLUZONE HIGH-DOSE) 0.5 ML injection Inject 0.5 mLs into the muscle once.    [provider]    Inpatient Medications: Scheduled Meds: . aspirin EC  81 mg Oral Daily  . atenolol  50 mg Oral Daily  . calcium carbonate  3 tablet Oral  Daily  . cholecalciferol  1,000 Units Oral Daily  . furosemide  40 mg Intravenous BID  . heparin  5,000 Units Subcutaneous Q8H  . multivitamin with minerals  1 tablet Oral Daily  . potassium chloride  20 mEq Oral Daily  . simvastatin  20 mg Oral Daily  . sodium chloride flush  3 mL Intravenous Q12H  . vitamin C  250 mg Oral Daily  . vitamin E  400 Units Oral Daily   Continuous Infusions: . sodium chloride     PRN Meds:   Allergies:   Allergies  Allergen Reactions  . Penicillins Rash    DID THE REACTION INVOLVE: Swelling of the face/tongue/throat, SOB, or low BP? No Sudden or severe rash/hives, skin peeling, or the inside of the mouth or nose? No Did it require medical treatment? No When did it last happen?more than 10 years ago If all above answers are "NO", may proceed with cephalosporin use.     Social History:   Social History   Socioeconomic History  . Marital status: Widowed    Spouse name: Not on file  . Number of children: Not on file  . Years of education: Not on file  . Highest education level: Not on file  Occupational History  . Occupation: retired  Engineer, productionocial Needs  . Financial resource strain: Not on file  . Food insecurity:    Worry: Not on file    Inability: Not on file  . Transportation needs:    Medical: Not on file    Non-medical: Not on file  Tobacco Use  . Smoking status: Never Smoker  . Smokeless tobacco: Never Used  Substance and Sexual Activity  . Alcohol use: Never    Frequency: Never  . Drug use: Never  . Sexual activity: Not Currently  Lifestyle  . Physical activity:    Days per week: Not on file    Minutes per session: Not on file  . Stress: Not on file  Relationships  . Social connections:    Talks on phone: Not on file    Gets together: Not on file    Attends religious service: Not on file    Active member of club or organization: Not on file    Attends meetings of clubs or organizations: Not on file    Relationship  status: Not on file  . Intimate partner violence:    Fear of current or ex partner: Not on file    Emotionally abused: Not on file    Physically abused: Not on file    Forced sexual activity: Not on file  Other Topics Concern  . Not on file  Social History Narrative  . Not on file     Family History:   Family History  Problem Relation Age of Onset  . Diabetes Mellitus II Mother   . Heart disease Father   . COPD Father     ROS:  Review of Systems  Constitutional: Positive for  malaise/fatigue. Negative for chills, diaphoresis, fever and weight loss.  HENT: Negative for congestion.        Post nasal drip  Eyes: Negative for discharge and redness.  Respiratory: Positive for cough, sputum production and shortness of breath. Negative for hemoptysis and wheezing.        Sputum initially brown, then transitioned to white  Cardiovascular: Negative for chest pain, palpitations, orthopnea, claudication, leg swelling and PND.       Chest tightness  Gastrointestinal: Negative for abdominal pain, blood in stool, heartburn, melena, nausea and vomiting.  Genitourinary: Negative for hematuria.  Musculoskeletal: Negative for falls and myalgias.  Skin: Negative for rash.  Neurological: Positive for weakness. Negative for dizziness, tingling, tremors, sensory change, speech change, focal weakness and loss of consciousness.  Endo/Heme/Allergies: Does not bruise/bleed easily.  Psychiatric/Behavioral: Negative for substance abuse. The patient is not nervous/anxious.       Physical Exam/Data:   Vitals:   06/16/18 1000 06/16/18 1030 06/16/18 1100 06/16/18 1135  BP: (!) 113/52 (!) 107/44 96/77 (!) 126/52  Pulse: (!) 54 (!) 51 (!) 59 61  Resp: 17 (!) 24 (!) 23   Temp:    (!) 97.4 F (36.3 C)  TempSrc:    Oral  SpO2: (!) 89% (!) 89% 100% 98%    Intake/Output Summary (Last 24 hours) at 06/16/2018 1213 Last data filed at 06/16/2018 0941 Gross per 24 hour  Intake -  Output 20 ml  Net -20 ml    There were no vitals filed for this visit. There is no height or weight on file to calculate BMI.   Physical Exam: General: Well developed, well nourished, in no acute distress. Head: Normocephalic, atraumatic, sclera non-icteric, no xanthomas, nares without discharge.  Neck: Left-sided carotid bruit vs radiation from murmur. JVD difficult to assess. Lungs: Diminished breath sounds bilaterally with faint crackles along the bases. Breathing is unlabored. Heart: RRR with S1 S2. III/VI harsh murmur heard throughout though best noted with the bell along the upper sternal border, no rubs, or gallops appreciated. Abdomen: Soft, non-tender, non-distended with normoactive bowel sounds. No hepatomegaly. No rebound/guarding. No obvious abdominal masses. Msk:  Strength and tone appear normal for age. Extremities: No clubbing or cyanosis. Trace bilateral pre-tibial edema. Distal pedal pulses are 2+ and equal bilaterally. Neuro: Alert and oriented X 3. No facial asymmetry. No focal deficit. Moves all extremities spontaneously. Psych:  Responds to questions appropriately with a normal affect.   EKG:  The EKG was personally reviewed and demonstrates: NSR, 60 bpm, nonspecific inferolateral st/t changes  Telemetry:  Telemetry was personally reviewed and demonstrates: NSR  Weights: There were no vitals filed for this visit.  Patient has not yet been weighed at time of cardiology consult. Order has been placed.   Relevant CV Studies: none  Laboratory Data:  Chemistry Recent Labs  Lab 06/16/18 0829  NA 138  K 4.7  CL 102  CO2 22  GLUCOSE 191*  BUN 30*  CREATININE 1.27*  CALCIUM 9.6  GFRNONAA 36*  GFRAA 42*  ANIONGAP 14    Recent Labs  Lab 06/16/18 0829  PROT 7.6  ALBUMIN 3.9  AST 30  ALT 21  ALKPHOS 69  BILITOT 1.1   Hematology Recent Labs  Lab 06/16/18 0829  WBC 11.0*  RBC 3.93  HGB 11.6*  HCT 36.5  MCV 92.9  MCH 29.5  MCHC 31.8  RDW 13.6  PLT 349   Cardiac  Enzymes Recent Labs  Lab 06/16/18 0829  TROPONINI 0.05*  No results for input(s): TROPIPOC in the last 168 hours.  BNP Recent Labs  Lab 06/16/18 0828  BNP 1,541.0*    DDimer No results for input(s): DDIMER in the last 168 hours.  Radiology/Studies:  Dg Chest Portable 1 View  Result Date: 06/16/2018 IMPRESSION: Mild CHF, with diffuse interstitial and airspace pulmonary edema and likely small bilateral pleural effusions. Electronically Signed   By: Hulan Saas M.D.   On: 06/16/2018 08:46    Assessment and Plan:   1. Acute CHF, type unknown: -She continues to appear volume overloaded -Agree with IV Lasix 40 mg bid with KCl repletion -Echo pending -If echo demonstrates a newly found cardiomyopathy, we will need to discuss potential ischemic evaluation (pretty high functional status at baseline given age) -If she is found to have a significant cardiomyopathy, would stop atenolol in favor or Toprol or Coreg. WOuld also stop PTA amlodipine and escalate evidence-based heart failure therapy  -CHF education -Daily weights, strict I/O  2. Elevated troponin: -Never with chest pain -Initial troponin mildly elevated at 0.05, continue to cycle until peak and down trend -Echo pending -No indication for heparin gtt at this time unless there is dynamic elevation of troponin  -ASA  3. Acute respiratory distress with hypoxia: -Wean oxygen as able per IM -In the setting of the above  4. AKI: -Monitor with diuresis  5. HTN: -Blood pressure currently well controlled -Atenolol and Lasix   6. HLD: -Check lipid panel -Currently on PTA simvastatin   7. Leukocytosis with elevated lactic acid: -Per IM  8. Anemia: -Baseline uncertain -Per IM  9. Hyperglycemia: -Check A1c   For questions or updates, please contact [facility] Please consult [email or web address] for contact info under Cardiology/STEMI.   Signed, Eula Listen, PA-C [facility] Pager: [phone] 06/16/2018, 12:13 PM

## 2018-06-16 NOTE — Progress Notes (Signed)
Advanced care plan.  Purpose of the Encounter: CODE STATUS  Parties in Attendance: Patient and family  Patient's Decision Capacity: Good  Subjective/Patient's story: Presented to emergency room for shortness of breath   Objective/Medical story Patient is acute pulmonary edema Needs evaluation for CHF and IV Lasix for diuresis   Goals of care determination:  Advance care directives goals of care and treatment plan discussed Patient does not want any CPR, intubation ventilator if the need arises   CODE STATUS: DNR   Time spent discussing advanced care planning: 16 minutes

## 2018-06-16 NOTE — H&P (Signed)
[facility] [place] [facility]   PATIENT NAME: Natalie Sherman    MR#:  [id number]  DATE OF BIRTH:  03-May-1922  DATE OF ADMISSION:  06/16/2018  PRIMARY CARE PHYSICIAN: Lupita RaiderShaw, Kimberlee, MD   REQUESTING/REFERRING PHYSICIAN:   CHIEF COMPLAINT:   Chief Complaint  Patient presents with  . Shortness of Breath    HISTORY OF PRESENT ILLNESS: Natalie CostaHilda Haywood  is a 84 y.o. female with a known history of hypertension, hyperlipidemia presented to the emergency room for shortness of breath for the last couple of days.  Patient does not use any oxygen at home.  She has noticed some swelling in the lower extremities.  She was having low oxygen saturation in the emergency room she was put on oxygen by nasal cannula.  She was evaluated chest x-ray which showed pulmonary edema and patient was given IV Lasix for diuresis.  Patient also has some chest discomfort.  She was evaluated in the emergency room and hospitalist service was consulted.  EKG normal sinus rhythm with no ST segment elevation.  PAST MEDICAL HISTORY: Hypertension, hyperlipidemia  PAST SURGICAL HISTORY: None  SOCIAL HISTORY:  Social History   Tobacco Use  . Smoking status: Never Smoker  . Smokeless tobacco: Never Used  Substance Use Topics  . Alcohol use: Never    Frequency: Never    FAMILY HISTORY: Mother deceased had diabetes mellitus Father deceased had heart disease and COPD  DRUG ALLERGIES:  Allergies  Allergen Reactions  . Penicillins Rash    DID THE REACTION INVOLVE: Swelling of the face/tongue/throat, SOB, or low BP? No Sudden or severe rash/hives, skin peeling, or the inside of the mouth or nose? No Did it require medical treatment? No When did it last happen?more than 10 years ago If all above answers are "NO", may proceed with cephalosporin use.     REVIEW OF SYSTEMS:   CONSTITUTIONAL: No fever, fatigue or weakness.  EYES: No blurred or double vision.  EARS, NOSE, AND  THROAT: No tinnitus or ear pain.  RESPIRATORY: No cough, has shortness of breath, no wheezing or hemoptysis.  CARDIOVASCULAR: Had chest pain, orthopnea, edema.  GASTROINTESTINAL: No nausea, vomiting, diarrhea or abdominal pain.  GENITOURINARY: No dysuria, hematuria.  ENDOCRINE: No polyuria, nocturia,  HEMATOLOGY: No anemia, easy bruising or bleeding SKIN: No rash or lesion. MUSCULOSKELETAL: No joint pain or arthritis.   NEUROLOGIC: No tingling, numbness, weakness.  PSYCHIATRY: No anxiety or depression.   MEDICATIONS AT HOME:  Prior to Admission medications   Medication Sig Start Date End Date Taking? Authorizing Provider  amLODipine (NORVASC) 10 MG tablet Take 1 tablet by mouth daily.   Yes [provider]  atenolol (TENORMIN) 50 MG tablet Take 1 tablet by mouth daily.   Yes [provider]  calcium carbonate (OSCAL) 1500 (600 Ca) MG TABS tablet Take 1,500 mg by mouth daily.   Yes [provider]  cholecalciferol (VITAMIN D3) 25 MCG (1000 UT) tablet Take 1,000 Units by mouth daily.   Yes [provider]  Multiple Vitamin (MULTIVITAMIN WITH MINERALS) TABS tablet Take 1 tablet by mouth daily.   Yes [provider]  Multiple Vitamins-Minerals (PRESERVISION/LUTEIN) CAPS Take 1 capsule by mouth daily.   Yes [provider]  simvastatin (ZOCOR) 20 MG tablet Take 1 tablet by mouth daily.   Yes [provider]  vitamin C (ASCORBIC ACID) 250 MG tablet Take 250 mg by mouth daily.   Yes [provider]  vitamin E 400 UNIT  capsule Take 400 Units by mouth daily.   Yes [provider]  HYDROcodone-acetaminophen (NORCO/VICODIN) 5-325 MG tablet Take 1 tablet by mouth every 6 (six) hours as needed.    [provider]  Influenza vac split quadrivalent PF (FLUZONE HIGH-DOSE) 0.5 ML injection Inject 0.5 mLs into the muscle once.    [provider]      PHYSICAL EXAMINATION:   VITAL SIGNS: Blood pressure  96/77, pulse (!) 59, temperature 97.8 F (36.6 C), temperature source Oral, resp. rate (!) 23, SpO2 100 %.  GENERAL:  84 y.o.-year-old patient lying in the bed with no acute distress.  EYES: Pupils equal, round, reactive to light and accommodation. No scleral icterus. Extraocular muscles intact.  HEENT: Head atraumatic, normocephalic. Oropharynx and nasopharynx clear.  NECK:  Supple, no jugular venous distention. No thyroid enlargement, no tenderness.  LUNGS: Decreased breath sounds bilaterally, bibasilar crepitations heard. No use of accessory muscles of respiration.  CARDIOVASCULAR: S1, S2 normal. No murmurs, rubs, or gallops.  ABDOMEN: Soft, nontender, nondistended. Bowel sounds present. No organomegaly or mass.  EXTREMITIES: Has pedal edema,no cyanosis, or clubbing.  NEUROLOGIC: Cranial nerves II through XII are intact. Muscle strength 5/5 in all extremities. Sensation intact. Gait not checked.  PSYCHIATRIC: The patient is alert and oriented x 3.  SKIN: No obvious rash, lesion, or ulcer.   LABORATORY PANEL:   CBC Recent Labs  Lab 06/16/18 0829  WBC 11.0*  HGB 11.6*  HCT 36.5  PLT 349  MCV 92.9  MCH 29.5  MCHC 31.8  RDW 13.6  LYMPHSABS 1.3  MONOABS 0.6  EOSABS 0.2  BASOSABS 0.2*   ------------------------------------------------------------------------------------------------------------------  Chemistries  Recent Labs  Lab 06/16/18 0829  NA 138  K 4.7  CL 102  CO2 22  GLUCOSE 191*  BUN 30*  CREATININE 1.27*  CALCIUM 9.6  AST 30  ALT 21  ALKPHOS 69  BILITOT 1.1   ------------------------------------------------------------------------------------------------------------------ CrCl cannot be calculated (Unknown ideal weight.). ------------------------------------------------------------------------------------------------------------------ No results for input(s): TSH, T4TOTAL, T3FREE, THYROIDAB in the last 72 hours.  Invalid input(s):  FREET3   Coagulation profile No results for input(s): INR, PROTIME in the last 168 hours. ------------------------------------------------------------------------------------------------------------------- No results for input(s): DDIMER in the last 72 hours. -------------------------------------------------------------------------------------------------------------------  Cardiac Enzymes Recent Labs  Lab 06/16/18 0829  TROPONINI 0.05*   ------------------------------------------------------------------------------------------------------------------ Invalid input(s): POCBNP  ---------------------------------------------------------------------------------------------------------------  Urinalysis No results found for: COLORURINE, APPEARANCEUR, LABSPEC, PHURINE, GLUCOSEU, HGBUR, BILIRUBINUR, KETONESUR, PROTEINUR, UROBILINOGEN, NITRITE, LEUKOCYTESUR   RADIOLOGY: Dg Chest Portable 1 View  Result Date: 06/16/2018 CLINICAL DATA:  84 year old with shortness of breath over the past several days. Hypoxia upon arrival in the emergency department with oxygen saturation of 82% on room air. EXAM: PORTABLE CHEST 1 VIEW COMPARISON:  None. FINDINGS: Cardiac silhouette mildly enlarged. Thoracic aorta atherosclerotic. Hilar and mediastinal contours otherwise unremarkable. Diffuse interstitial and airspace opacities throughout both lungs. No confluent airspace consolidation. Small pleural effusions suspected. Osseous demineralization. Remote healed fracture involving the RIGHT clavicular head. IMPRESSION: Mild CHF, with diffuse interstitial and airspace pulmonary edema and likely small bilateral pleural effusions. Electronically Signed   By: Hulan Saas M.D.   On: 06/16/2018 08:46    EKG: Orders placed or performed during the hospital encounter of 06/16/18  . EKG 12-Lead  . EKG 12-Lead    IMPRESSION AND PLAN:  84 year old elderly female patient with history of hypertension, hyperlipidemia  presented to the emergency room for shortness of breath  -Acute pulmonary edema Admit patient to telemetry Diurese patient with IV Lasix 40  MDQ 12 hourly Monitor electrolytes Check echocardiogram to assess LV function Cardiology consultation with [facility] health cardiology Message sent via epic haiku Evaluate for heart failure  -Hypoxia secondary to pulmonary edema Oxygen via nasal cannula 2 L  -Hypertension Continue atenolol for control of blood pressure Monitor heart rate  -Elevated lactic acid No evidence of any infection on chest x-ray Follow-up urinalysis Follow-up lactic acid  -Elevated troponin could be from demand ischemia from pulmonary edema and heart failure Cycle troponin Cardiology consult  All the records are reviewed and case discussed with ED provider. Management plans discussed with the patient, family and they are in agreement.  CODE STATUS:DNR    Code Status Orders  (From admission, onward)         Start     Ordered   06/16/18 1109  Do not attempt resuscitation (DNR)  Continuous    Question Answer Comment  In the event of cardiac or respiratory ARREST Do not call a "code blue"   In the event of cardiac or respiratory ARREST Do not perform Intubation, CPR, defibrillation or ACLS   In the event of cardiac or respiratory ARREST Use medication by any route, position, wound care, and other measures to relive pain and suffering. May use oxygen, suction and manual treatment of airway obstruction as needed for comfort.      06/16/18 1108        Code Status History    This patient has a current code status but no historical code status.       TOTAL TIME TAKING CARE OF THIS PATIENT: 54 minutes.    Ihor Austin M.D on 06/16/2018 at 11:12 AM  Between 7am to 6pm - Pager - [phone]  After 6pm go to [email or web address] - password [email or web address] [facility]  [facility]  Office  [phone]  CC: Primary care physician; Lupita Raider, MD

## 2018-06-16 NOTE — ED Provider Notes (Signed)
[facility] Emergency Department Provider Note  Time seen: 8:29 AM  I have reviewed the triage vital signs and the nursing notes.   HISTORY  Chief Complaint Shortness of Breath    HPI Natalie Sherman is a 84 y.o. female presents to the emergency department for shortness of breath.  According to the patient and daughter for the past 2 to 3 days the patient has been coughing complaining of intermittent pain in her chest and has been feeling very short of breath especially with any type of exertion.  Finally came to the ER for evaluation noted to have mildly labored breathing especially when attempting to exert herself.  Patient noted to have an 82% room air saturation upon arrival.  No home O2 requirement at baseline.  Patient denies any chest pain right now denies any shortness of breath but states she will become short of breath with any type of exertion.  Although she denies shortness of breath she does appear mild to moderately short of breath in the emergency department.  No leg pain or swelling.  No abdominal pain.    Past medical history: Hypertension Hyperlipidemia  Prior to Admission medications   Medication Sig Start Date End Date Taking? Authorizing Provider  amLODipine (NORVASC) 10 MG tablet Take 1 tablet by mouth daily.   Yes [provider]  atenolol (TENORMIN) 50 MG tablet Take 1 tablet by mouth daily.   Yes [provider]  calcium carbonate (OSCAL) 1500 (600 Ca) MG TABS tablet Take 1,500 mg by mouth daily.   Yes [provider]  cholecalciferol (VITAMIN D3) 25 MCG (1000 UT) tablet Take 1,000 Units by mouth daily.   Yes [provider]  Multiple Vitamin (MULTIVITAMIN WITH MINERALS) TABS tablet Take 1 tablet by mouth daily.   Yes [provider]  Multiple Vitamins-Minerals (PRESERVISION/LUTEIN) CAPS Take 1 capsule by mouth daily.   Yes [provider]  simvastatin (ZOCOR) 20 MG tablet Take 1  tablet by mouth daily.   Yes [provider]  vitamin C (ASCORBIC ACID) 250 MG tablet Take 250 mg by mouth daily.   Yes [provider]  vitamin E 400 UNIT capsule Take 400 Units by mouth daily.   Yes [provider]  HYDROcodone-acetaminophen (NORCO/VICODIN) 5-325 MG tablet Take 1 tablet by mouth every 6 (six) hours as needed.    [provider]  Influenza vac split quadrivalent PF (FLUZONE HIGH-DOSE) 0.5 ML injection Inject 0.5 mLs into the muscle once.    [provider]    Allergies  Allergen Reactions  . Penicillins Rash    DID THE REACTION INVOLVE: Swelling of the face/tongue/throat, SOB, or low BP? No Sudden or severe rash/hives, skin peeling, or the inside of the mouth or nose? No Did it require medical treatment? No When did it last happen?more than 10 years ago If all above answers are "NO", may proceed with cephalosporin use.     No family history on file.  Social History Social History   Tobacco Use  . Smoking status: Not on file  Substance Use Topics  . Alcohol use: Not on file  . Drug use: Not on file    Review of Systems Constitutional: Negative for fever. Cardiovascular: Intermittent chest pain x2 days Respiratory: Shortness of breath, worse with exertion Gastrointestinal: Negative for abdominal pain Genitourinary: Negative for urinary compaints Musculoskeletal: Negative for musculoskeletal complaints Skin: Negative for skin complaints  Neurological: Negative for headache All other ROS negative  ____________________________________________  PHYSICAL EXAM:  VITAL SIGNS: ED Triage Vitals [06/16/18 0820]  Enc Vitals Group     BP (!) 127/51     Pulse Rate (!) 59     Resp (!) 40     Temp 97.8 F (36.6 C)     Temp Source Oral     SpO2 (!) 82 %     Weight      Height      Head Circumference      Peak Flow      Pain Score 0     Pain Loc      Pain Edu?      Excl. in GC?    Constitutional: Alert  and oriented. Well appearing and in no distress. Eyes: Normal exam ENT   Head: Normocephalic and atraumatic.   Mouth/Throat: Mucous membranes are moist. Cardiovascular: Normal rate, regular rhythm.  3/6 systolic ejection murmur Respiratory: Normal respiratory effort without tachypnea nor retractions. Breath sounds are clear  Gastrointestinal: Soft and nontender. No distention.   Musculoskeletal: Nontender with normal range of motion in all extremities.  Neurologic:  Normal speech and language. No gross focal neurologic deficits Skin:  Skin is warm, dry and intact.  Psychiatric: Mood and affect are normal.   ____________________________________________    EKG  EKG viewed and interpreted by myself shows normal sinus rhythm at 60 bpm with a narrow QRS, normal axis, normal intervals, patient does have mild lateral ST depressions.  No ST elevation.  ____________________________________________    RADIOLOGY  diffuse interstitial and pulmonary edema  ____________________________________________   INITIAL IMPRESSION / ASSESSMENT AND PLAN / ED COURSE  Pertinent labs & imaging results that were available during my care of the patient were reviewed by me and considered in my medical decision making (see chart for details).  Patient presents to the emergency department for shortness of breath and intermittent chest pain x2 to 3 days found to be hypoxic to 82% on room air.  Differential at this time would include pneumonia, pneumothorax, ACS, pulmonary edema, influenza/pneumonitis.  We will check labs, chest x-ray, treat with a DuoNeb, obtain influenza screen and continue to closely monitor.  Chest x-ray is most consistent with pulmonary edema.  Patient's troponin is very slightly elevated, labs are otherwise largely reassuring.  We will dose IV Lasix.  Flu swab pending.  Flu swab is negative.  Patient does have a significant heart murmur appears to be systolic ejection murmur.   Patient's condition could be a result of decreased ejection fraction due to valvular disease.  Regardless I believe the patient benefit from hospital admission continue with diuresis and have cardiology see the patient, as well as an echocardiogram.  We will discuss with the hospitalist for admission.  Patient currently satting 90 to 93% on 6 L.  ____________________________________________   FINAL CLINICAL IMPRESSION(S) / ED DIAGNOSES  Hypoxia Dyspnea Congestive heart failure   Minna Antis, MD 06/16/18 1003

## 2018-06-16 NOTE — ED Notes (Signed)
Warm blanket given at this time 

## 2018-06-16 NOTE — ED Triage Notes (Signed)
Pt states that started feeling bad before christmas, cough and congestion and states that she keeps having spells of sob and pain in her chest when she is trying to get a good breath in, pt is labored to breathe with talking in triage, room air sat of 82%

## 2018-06-16 NOTE — ED Notes (Signed)
Admitting at bedside 

## 2018-06-16 NOTE — ED Notes (Signed)
Pt was 78% on RA. 5L [place] and pt came up to 91%.

## 2018-06-16 NOTE — Care Management Note (Signed)
Case Management Note  Patient Details  Name: Natalie Sherman MRN: [id number] Date of Birth: 09-16-1921  Subjective/Objective: Patient admitted for pulmonary edema.  Patient is from home and lives alone.  Patient's daughter and son in law are at the bedside and live in [place].  RNCM consult for home health needs, patient is okay with home health services at discharge- [facility] choice offered and family and patient choose [facility].  Patient reports that she is independent in ADL's, she has a stationary bike that she uses for exercise.  Patient still drives.  PCP verified as Dr. Clelia Croft and she goes once per year for her annual physical.  Pharmacy is [facility] in [place].  RNCM will cont to follow. Robbie Lis RN BSN [phone]                 Action/Plan:   Expected Discharge Date:                  Expected Discharge Plan:  Home w Home Health Services  In-House Referral:     Discharge planning Services  CM Consult  Post Acute Care Choice:  Home Health Choice offered to:  Patient  DME Arranged:    DME Agency:     HH Arranged:    HH Agency:  [facility] (now [facility])  Status of Service:  In process, will continue to follow  If discussed at Long Length of Stay Meetings, dates discussed:    Additional Comments:  Allayne Butcher, RN 06/16/2018, 4:12 PM

## 2018-06-17 ENCOUNTER — Inpatient Hospital Stay
Admit: 2018-06-17 | Discharge: 2018-06-17 | Disposition: A | Discharge status: Home / Self Care | Payer: Medicare Other | Attending: Internal Medicine | Admitting: Internal Medicine

## 2018-06-17 LAB — ECHOCARDIOGRAM COMPLETE: Weight: 2120 oz

## 2018-06-17 LAB — CBC
HCT: 28.9 % — ABNORMAL LOW (ref 36.0–46.0)
Hemoglobin: 9.2 g/dL — ABNORMAL LOW (ref 12.0–15.0)
MCH: 29.3 pg (ref 26.0–34.0)
MCHC: 31.8 g/dL (ref 30.0–36.0)
MCV: 92 fL (ref 80.0–100.0)
Platelets: 262 10*3/uL (ref 150–400)
RBC: 3.14 MIL/uL — ABNORMAL LOW (ref 3.87–5.11)
RDW: 13.6 % (ref 11.5–15.5)
WBC: 8.1 10*3/uL (ref 4.0–10.5)
nRBC: 0 % (ref 0.0–0.2)

## 2018-06-17 LAB — TROPONIN I: Troponin I: 0.18 ng/mL (ref <0.03)

## 2018-06-17 LAB — BASIC METABOLIC PANEL
Anion gap: 9 (ref 5–15)
BUN: 32 mg/dL — ABNORMAL HIGH (ref 8–23)
CO2: 25 mmol/L (ref 22–32)
Calcium: 8.4 mg/dL — ABNORMAL LOW (ref 8.9–10.3)
Chloride: 102 mmol/L (ref 98–111)
Creatinine, Ser: 1.23 mg/dL — ABNORMAL HIGH (ref 0.44–1.00)
GFR calc Af Amer: 43 mL/min — ABNORMAL LOW (ref >60)
GFR calc non Af Amer: 38 mL/min — ABNORMAL LOW (ref >60)
Glucose, Bld: 120 mg/dL — ABNORMAL HIGH (ref 70–99)
POTASSIUM: 3.5 mmol/L (ref 3.5–5.1)
Sodium: 136 mmol/L (ref 135–145)

## 2018-06-17 LAB — LIPID PANEL
Cholesterol: 120 mg/dL (ref 0–200)
HDL: 25 mg/dL — ABNORMAL LOW (ref >40)
LDL Cholesterol: 72 mg/dL (ref 0–99)
Total CHOL/HDL Ratio: 4.8 RATIO
Triglycerides: 117 mg/dL (ref <150)
VLDL: 23 mg/dL (ref 0–40)

## 2018-06-17 NOTE — Progress Notes (Signed)
Progress Note  Patient Name: Natalie SchimkeHilda Q Sherman Date of Encounter: 06/17/2018  Primary Cardiologist: Kennith MaesArrida (new)  Subjective   Better than yesterday.  Continues to diuresis.  Inpatient Medications    Scheduled Meds: . aspirin EC  81 mg Oral Daily  . atenolol  50 mg Oral Daily  . calcium carbonate  3 tablet Oral Daily  . cholecalciferol  1,000 Units Oral Daily  . furosemide  40 mg Intravenous BID  . heparin  5,000 Units Subcutaneous Q8H  . multivitamin with minerals  1 tablet Oral Daily  . potassium chloride  20 mEq Oral Daily  . simvastatin  20 mg Oral Daily  . sodium chloride flush  3 mL Intravenous Q12H  . vitamin C  250 mg Oral Daily  . vitamin E  400 Units Oral Daily   Continuous Infusions: . sodium chloride     PRN Meds: sodium chloride, acetaminophen **OR** acetaminophen, HYDROcodone-acetaminophen, ondansetron **OR** ondansetron (ZOFRAN) IV, senna-docusate, sodium chloride flush   Vital Signs    Vitals:   06/16/18 1932 06/16/18 1944 06/17/18 0506 06/17/18 0900  BP: (!) 89/53 (!) 91/43 (!) 120/50 (!) 124/49  Pulse: 63 60 (!) 51 62  Resp: 20  20 (!) 22  Temp: 98 F (36.7 C)  98 F (36.7 C) (!) 97.4 F (36.3 C)  TempSrc: Oral  Oral Oral  SpO2: 95% 93% 99% 96%  Weight:   60.1 kg     Intake/Output Summary (Last 24 hours) at 06/17/2018 1226 Last data filed at 06/17/2018 0506 Gross per 24 hour  Intake 3 ml  Output 700 ml  Net -697 ml   Filed Weights   06/17/18 0506  Weight: 60.1 kg    Telemetry    Sinus rhythm- Personally Reviewed  ECG    Sinus rhythm- Personally Reviewed  Physical Exam   GEN: No acute distress.   Neck: No JVD Cardiac: RRR, he had 6 systolic crescendo/decrescendo murmur, no rubs, or gallops.  Respiratory: Clear to auscultation bilaterally. GI: Soft, nontender, non-distended  MS: No edema; No deformity. Neuro:  Nonfocal  Psych: Normal affect   Labs    Chemistry Recent Labs  Lab 06/16/18 0829 06/17/18 0400  NA 138  136  K 4.7 3.5  CL 102 102  CO2 22 25  GLUCOSE 191* 120*  BUN 30* 32*  CREATININE 1.27* 1.23*  CALCIUM 9.6 8.4*  PROT 7.6  --   ALBUMIN 3.9  --   AST 30  --   ALT 21  --   ALKPHOS 69  --   BILITOT 1.1  --   GFRNONAA 36* 38*  GFRAA 42* 43*  ANIONGAP 14 9     Hematology Recent Labs  Lab 06/16/18 0829 06/17/18 0400  WBC 11.0* 8.1  RBC 3.93 3.14*  HGB 11.6* 9.2*  HCT 36.5 28.9*  MCV 92.9 92.0  MCH 29.5 29.3  MCHC 31.8 31.8  RDW 13.6 13.6  PLT 349 262    Cardiac Enzymes Recent Labs  Lab 06/16/18 0829 06/16/18 1217 06/16/18 1810 06/16/18 2330  TROPONINI 0.05* 0.07* 0.13* 0.18*   No results for input(s): TROPIPOC in the last 168 hours.   BNP Recent Labs  Lab 06/16/18 0828  BNP 1,541.0*     DDimer No results for input(s): DDIMER in the last 168 hours.   Radiology    Dg Chest Portable 1 View  Result Date: 06/16/2018 CLINICAL DATA:  84 year old with shortness of breath over the past several days. Hypoxia upon arrival in the  emergency department with oxygen saturation of 82% on room air. EXAM: PORTABLE CHEST 1 VIEW COMPARISON:  None. FINDINGS: Cardiac silhouette mildly enlarged. Thoracic aorta atherosclerotic. Hilar and mediastinal contours otherwise unremarkable. Diffuse interstitial and airspace opacities throughout both lungs. No confluent airspace consolidation. Small pleural effusions suspected. Osseous demineralization. Remote healed fracture involving the RIGHT clavicular head. IMPRESSION: Mild CHF, with diffuse interstitial and airspace pulmonary edema and likely small bilateral pleural effusions. Electronically Signed   By: Hulan Saas M.D.   On: 06/16/2018 08:46    Cardiac Studies   TTE pending  Patient Profile     84 y.o. female with a history of hypertension and hyperlipidemia who presented to the hospital with CHF.  Assessment & Plan    1.  Acute heart failure: Echo is currently pending to determine if this is systolic or diastolic heart  failure.  She is diuresing well and is feeling quite a bit better.  She does continue to have crackles in her lungs.  Would continue with IV diuresis gently at this time.  Her creatinine has remained stable.  2.  Systolic murmur: Concerned that this is due to severe aortic stenosis which could be driving her volume overload.  Echo pending.  She may be a candidate for TAVR as an outpatient.  3.  Acute respiratory failure with hypoxia: Wean oxygen per internal medicine.  Continue with diuresis.      For questions or updates, please contact [facility] Please consult [email or web address] for contact info under        Signed, Tajon Moring Jorja Loa, MD  06/17/2018, 12:26 PM

## 2018-06-17 NOTE — Plan of Care (Signed)

## 2018-06-17 NOTE — Progress Notes (Signed)
[employer] [facility] [place] [facility]   PATIENT NAME: Natalie Sherman    MR#:  [id number]  DATE OF BIRTH:  28-Dec-1921  SUBJECTIVE:  Patient without complaint, feeling better, no events overnight, cardiology input appreciated, follow-up on echocardiogram, physical therapy to see  REVIEW OF SYSTEMS:  CONSTITUTIONAL: No fever, fatigue or weakness.  EYES: No blurred or double vision.  EARS, NOSE, AND THROAT: No tinnitus or ear pain.  RESPIRATORY: No cough, shortness of breath, wheezing or hemoptysis.  CARDIOVASCULAR: No chest pain, orthopnea, edema.  GASTROINTESTINAL: No nausea, vomiting, diarrhea or abdominal pain.  GENITOURINARY: No dysuria, hematuria.  ENDOCRINE: No polyuria, nocturia,  HEMATOLOGY: No anemia, easy bruising or bleeding SKIN: No rash or lesion. MUSCULOSKELETAL: No joint pain or arthritis.   NEUROLOGIC: No tingling, numbness, weakness.  PSYCHIATRY: No anxiety or depression.   ROS  DRUG ALLERGIES:   Allergies  Allergen Reactions  . Penicillins Rash    DID THE REACTION INVOLVE: Swelling of the face/tongue/throat, SOB, or low BP? No Sudden or severe rash/hives, skin peeling, or the inside of the mouth or nose? No Did it require medical treatment? No When did it last happen?more than 10 years ago If all above answers are "NO", may proceed with cephalosporin use.     VITALS:  Blood pressure (!) 124/49, pulse 62, temperature (!) 97.4 F (36.3 C), temperature source Oral, resp. rate (!) 22, weight 60.1 kg, SpO2 96 %.  PHYSICAL EXAMINATION:  GENERAL:  84 y.o.-year-old patient lying in the bed with no acute distress.  EYES: Pupils equal, round, reactive to light and accommodation. No scleral icterus. Extraocular muscles intact.  HEENT: Head atraumatic, normocephalic. Oropharynx and nasopharynx clear.  NECK:  Supple, no jugular venous distention. No thyroid enlargement, no tenderness.  LUNGS: Normal breath sounds bilaterally, no wheezing,  rales,rhonchi or crepitation. No use of accessory muscles of respiration.  CARDIOVASCULAR: S1, S2 normal. No murmurs, rubs, or gallops.  ABDOMEN: Soft, nontender, nondistended. Bowel sounds present. No organomegaly or mass.  EXTREMITIES: No pedal edema, cyanosis, or clubbing.  NEUROLOGIC: Cranial nerves II through XII are intact. Muscle strength 5/5 in all extremities. Sensation intact. Gait not checked.  PSYCHIATRIC: The patient is alert and oriented x 3.  SKIN: No obvious rash, lesion, or ulcer.   Physical Exam LABORATORY PANEL:   CBC Recent Labs  Lab 06/17/18 0400  WBC 8.1  HGB 9.2*  HCT 28.9*  PLT 262   ------------------------------------------------------------------------------------------------------------------  Chemistries  Recent Labs  Lab 06/16/18 0829 06/17/18 0400  NA 138 136  K 4.7 3.5  CL 102 102  CO2 22 25  GLUCOSE 191* 120*  BUN 30* 32*  CREATININE 1.27* 1.23*  CALCIUM 9.6 8.4*  AST 30  --   ALT 21  --   ALKPHOS 69  --   BILITOT 1.1  --    ------------------------------------------------------------------------------------------------------------------  Cardiac Enzymes Recent Labs  Lab 06/16/18 1810 06/16/18 2330  TROPONINI 0.13* 0.18*   ------------------------------------------------------------------------------------------------------------------  RADIOLOGY:  Dg Chest Portable 1 View  Result Date: 06/16/2018 CLINICAL DATA:  84 year old with shortness of breath over the past several days. Hypoxia upon arrival in the emergency department with oxygen saturation of 82% on room air. EXAM: PORTABLE CHEST 1 VIEW COMPARISON:  None. FINDINGS: Cardiac silhouette mildly enlarged. Thoracic aorta atherosclerotic. Hilar and mediastinal contours otherwise unremarkable. Diffuse interstitial and airspace opacities throughout both lungs. No confluent airspace consolidation. Small pleural effusions suspected. Osseous demineralization. Remote healed fracture  involving the RIGHT clavicular head. IMPRESSION: Mild CHF, with  diffuse interstitial and airspace pulmonary edema and likely small bilateral pleural effusions. Electronically Signed   By: Hulan Saashomas  Lawrence M.D.   On: 06/16/2018 08:46    ASSESSMENT AND PLAN:  84 year old elderly female patient with history of hypertension, hyperlipidemia presented to the emergency room for shortness of breath  *Acute congestive heart failure exacerbation  Continue congestive heart failure protocol, IV Lasix twice daily, beta-blocker therapy, aspirin, statin therapy, strict I&O monitoring, daily weights, follow-up on echocardiogram, cardiology input appreciated  *History of aortic stenosis Follow-up on echocardiogram, per cardiology may be a candidate for TAVR  *Acute hypoxic respiratory failure Secondary to above Supplemental oxygen wean as tolerated  *Chronic benign essential hypertension  Stable  Continue home regiment   *Acute elevated troponins Most likely secondary to demand ischemia from heart failure exacerbation Cardiac enzymes inconsistent with acute coronary syndrome, cardiology input appreciated, follow-up echocardiogram  Disposition Home in 1 to 2 days with cardiology clearance barring any complications  All the records are reviewed and case discussed with Care Management/Social Workerr. Management plans discussed with the patient, family and they are in agreement.  CODE STATUS: dnr  TOTAL TIME TAKING CARE OF THIS PATIENT: 35 minutes.     POSSIBLE D/C IN 1-2 DAYS, DEPENDING ON CLINICAL CONDITION.   Evelena AsaMontell D Shell Yandow M.D on 06/17/2018   Between 7am to 6pm - Pager - [phone]  After 6pm go to [email or web address] - Social research officer, governmentpassword EPAS [facility]  [facility]  Office  [phone]  CC: Primary care physician; Lupita RaiderShaw, Kimberlee, MD  Note: This dictation was prepared with Dragon dictation along with smaller phrase technology. Any transcriptional errors that result from this  process are unintentional.

## 2018-06-18 LAB — BASIC METABOLIC PANEL
ANION GAP: 12 (ref 5–15)
BUN: 38 mg/dL — ABNORMAL HIGH (ref 8–23)
CO2: 30 mmol/L (ref 22–32)
Calcium: 9.5 mg/dL (ref 8.9–10.3)
Chloride: 95 mmol/L — ABNORMAL LOW (ref 98–111)
Creatinine, Ser: 1.25 mg/dL — ABNORMAL HIGH (ref 0.44–1.00)
GFR calc Af Amer: 43 mL/min — ABNORMAL LOW (ref >60)
GFR calc non Af Amer: 37 mL/min — ABNORMAL LOW (ref >60)
Glucose, Bld: 154 mg/dL — ABNORMAL HIGH (ref 70–99)
Potassium: 4.2 mmol/L (ref 3.5–5.1)
Sodium: 137 mmol/L (ref 135–145)

## 2018-06-18 NOTE — Progress Notes (Signed)
Progress Note  Patient Name: Natalie Sherman Date of Encounter: 06/18/2018  Primary Cardiologist: Kennith Maes (new)  Subjective   Better than yesterday.  Continued with diuresis.  Echo yesterday showed critical aortic stenosis.  Inpatient Medications    Scheduled Meds: . aspirin EC  81 mg Oral Daily  . atenolol  50 mg Oral Daily  . calcium carbonate  3 tablet Oral Daily  . cholecalciferol  1,000 Units Oral Daily  . furosemide  40 mg Intravenous BID  . heparin  5,000 Units Subcutaneous Q8H  . multivitamin with minerals  1 tablet Oral Daily  . potassium chloride  20 mEq Oral Daily  . simvastatin  20 mg Oral Daily  . sodium chloride flush  3 mL Intravenous Q12H  . vitamin C  250 mg Oral Daily  . vitamin E  400 Units Oral Daily   Continuous Infusions: . sodium chloride     PRN Meds: sodium chloride, acetaminophen **OR** acetaminophen, HYDROcodone-acetaminophen, ondansetron **OR** ondansetron (ZOFRAN) IV, senna-docusate, sodium chloride flush   Vital Signs    Vitals:   06/17/18 1726 06/17/18 2105 06/18/18 0635 06/18/18 0731  BP: (!) 108/45 (!) 102/57 (!) 113/48 (!) 112/41  Pulse:  60 (!) 56 (!) 56  Resp:  16 18 18   Temp:  97.9 F (36.6 C) 97.7 F (36.5 C) 97.9 F (36.6 C)  TempSrc:   Oral   SpO2:  95% 97% 97%  Weight:   59.6 kg     Intake/Output Summary (Last 24 hours) at 06/18/2018 1327 Last data filed at 06/18/2018 1041 Gross per 24 hour  Intake -  Output 950 ml  Net -950 ml   Filed Weights   06/17/18 0506 06/18/18 0635  Weight: 60.1 kg 59.6 kg    Telemetry    Sinus rhythm- Personally Reviewed  ECG    None new- Personally Reviewed  Physical Exam   GEN: Well nourished, well developed, in no acute distress  HEENT: normal  Neck: no JVD, carotid bruits, or masses Cardiac: RRR; 3 out of 6 systolic crescendo decrescendo murmur, no rubs, or gallops,no edema  Respiratory: Crackles bilaterally, normal work of breathing GI: soft, nontender, nondistended,  + BS MS: no deformity or atrophy  Skin: warm and dry Neuro:  Strength and sensation are intact Psych: euthymic mood, full affect   Labs    Chemistry Recent Labs  Lab 06/16/18 0829 06/17/18 0400  NA 138 136  K 4.7 3.5  CL 102 102  CO2 22 25  GLUCOSE 191* 120*  BUN 30* 32*  CREATININE 1.27* 1.23*  CALCIUM 9.6 8.4*  PROT 7.6  --   ALBUMIN 3.9  --   AST 30  --   ALT 21  --   ALKPHOS 69  --   BILITOT 1.1  --   GFRNONAA 36* 38*  GFRAA 42* 43*  ANIONGAP 14 9     Hematology Recent Labs  Lab 06/16/18 0829 06/17/18 0400  WBC 11.0* 8.1  RBC 3.93 3.14*  HGB 11.6* 9.2*  HCT 36.5 28.9*  MCV 92.9 92.0  MCH 29.5 29.3  MCHC 31.8 31.8  RDW 13.6 13.6  PLT 349 262    Cardiac Enzymes Recent Labs  Lab 06/16/18 0829 06/16/18 1217 06/16/18 1810 06/16/18 2330  TROPONINI 0.05* 0.07* 0.13* 0.18*   No results for input(s): TROPIPOC in the last 168 hours.   BNP Recent Labs  Lab 06/16/18 0828  BNP 1,541.0*     DDimer No results for input(s): DDIMER in the  last 168 hours.   Radiology    No results found.  Cardiac Studies   TTE pending  Patient Profile     84 y.o. female with a history of hypertension and hyperlipidemia who presented to the hospital with CHF.  Assessment & Plan    1.  Acute heart failure: Co. shows a normal ejection fraction, but critical aortic stenosis.  This is likely the cause of her acute heart failure exacerbation.  She is currently net -2.3 L.  I do feel that she needs more diuresis that she continues to have crackles.  Would continue IV diuresis at this time.    2.  Critical aortic stenosis: Likely the cause of her heart failure exacerbation.  She will need further work-up as an outpatient for possible TAVR, though she would be high risk.  3.  Acute respiratory failure with hypoxia: We will wean oxygen per internal medicine recommendations.  Continue with diuresis.      For questions or updates, please contact [facility] Please  consult [email or web address] for contact info under        Signed, Will Jorja LoaMartin Camnitz, MD  06/18/2018, 1:27 PM

## 2018-06-18 NOTE — Progress Notes (Signed)
[employer] -  at [facility]   PATIENT NAME: Natalie CostaHilda Sherman    MR#:  [id number]  DATE OF BIRTH:  1922-05-01  SUBJECTIVE:  Patient feels better, she was on oxygen by nasal cannula 4 L this morning, weaned down to 2 L.  REVIEW OF SYSTEMS:  CONSTITUTIONAL: No fever, fatigue or weakness.  EYES: No blurred or double vision.  EARS, NOSE, AND THROAT: No tinnitus or ear pain.  RESPIRATORY: No cough, shortness of breath, wheezing or hemoptysis.  CARDIOVASCULAR: No chest pain, orthopnea, edema.  GASTROINTESTINAL: No nausea, vomiting, diarrhea or abdominal pain.  GENITOURINARY: No dysuria, hematuria.  ENDOCRINE: No polyuria, nocturia,  HEMATOLOGY: No anemia, easy bruising or bleeding SKIN: No rash or lesion. MUSCULOSKELETAL: No joint pain or arthritis.   NEUROLOGIC: No tingling, numbness, weakness.  PSYCHIATRY: No anxiety or depression.   ROS  DRUG ALLERGIES:   Allergies  Allergen Reactions  . Penicillins Rash    DID THE REACTION INVOLVE: Swelling of the face/tongue/throat, SOB, or low BP? No Sudden or severe rash/hives, skin peeling, or the inside of the mouth or nose? No Did it require medical treatment? No When did it last happen?more than 10 years ago If all above answers are "NO", may proceed with cephalosporin use.     VITALS:  Blood pressure (!) 112/41, pulse (!) 56, temperature 97.9 F (36.6 C), resp. rate 18, weight 59.6 kg, SpO2 97 %.  PHYSICAL EXAMINATION:  GENERAL:  84 y.o.-year-old patient lying in the bed with no acute distress.  EYES: Pupils equal, round, reactive to light and accommodation. No scleral icterus. Extraocular muscles intact.  HEENT: Head atraumatic, normocephalic. Oropharynx and nasopharynx clear.  NECK:  Supple, no jugular venous distention. No thyroid enlargement, no tenderness.  LUNGS: Normal breath sounds bilaterally, no wheezing, mild rales, no rhonchi or crepitation. No use of accessory muscles of respiration.   CARDIOVASCULAR: S1, S2 normal. No murmurs, rubs, or gallops.  ABDOMEN: Soft, nontender, nondistended. Bowel sounds present. No organomegaly or mass.  EXTREMITIES: No pedal edema, cyanosis, or clubbing.  NEUROLOGIC: Cranial nerves II through XII are intact. Muscle strength 5/5 in all extremities. Sensation intact. Gait not checked.  PSYCHIATRIC: The patient is alert and oriented x 3.  SKIN: No obvious rash, lesion, or ulcer.   Physical Exam LABORATORY PANEL:   CBC Recent Labs  Lab 06/17/18 0400  WBC 8.1  HGB 9.2*  HCT 28.9*  PLT 262   ------------------------------------------------------------------------------------------------------------------  Chemistries  Recent Labs  Lab 06/16/18 0829  06/18/18 1413  NA 138   < > 137  K 4.7   < > 4.2  CL 102   < > 95*  CO2 22   < > 30  GLUCOSE 191*   < > 154*  BUN 30*   < > 38*  CREATININE 1.27*   < > 1.25*  CALCIUM 9.6   < > 9.5  AST 30  --   --   ALT 21  --   --   ALKPHOS 69  --   --   BILITOT 1.1  --   --    < > = values in this interval not displayed.   ------------------------------------------------------------------------------------------------------------------  Cardiac Enzymes Recent Labs  Lab 06/16/18 1810 06/16/18 2330  TROPONINI 0.13* 0.18*   ------------------------------------------------------------------------------------------------------------------  RADIOLOGY:  No results found.  ASSESSMENT AND PLAN:  84 year old elderly female patient with history of hypertension, hyperlipidemia presented to the emergency room for shortness of breath  *Acute diastolic congestive heart failure. Continue congestive  heart failure protocol, IV Lasix twice daily, beta-blocker therapy, aspirin, statin therapy, strict I&O monitoring, daily weights, follow-up on echocardiogram: EF 60-65%, severe aortic stenosis.  *Severe aortic stenosis Per cardiologist, need further work-up as an outpatient for possible TAVR, though  she would be high risk.  *Acute hypoxic respiratory failure Secondary to above Supplemental oxygen wean as tolerated, NEB prn.  *Chronic benign essential hypertension  Stable  Continue home regiment   *Acute elevated troponins Most likely secondary to demand ischemia from heart failure exacerbation Cardiac enzymes inconsistent with acute coronary syndrome.  Disposition Home in 1 to 2 days with cardiology clearance barring any complications  All the records are reviewed and case discussed with Care Management/Social Workerr. Management plans discussed with the patient, her daughter and they are in agreement.  CODE STATUS: dnr  TOTAL TIME TAKING CARE OF THIS PATIENT: 35 minutes.  POSSIBLE D/C IN 1-2 DAYS, DEPENDING ON CLINICAL CONDITION.   Shaune Pollack M.D on 06/18/2018   Between 7am to 6pm - Pager - [phone]  After 6pm go to [email or web address] - Social research officer, government  [facility]  Office  [phone]  CC: Primary care physician; Lupita Raider, MD  Note: This dictation was prepared with Dragon dictation along with smaller phrase technology. Any transcriptional errors that result from this process are unintentional.

## 2018-06-19 ENCOUNTER — Other Ambulatory Visit: Payer: Self-pay

## 2018-06-19 DIAGNOSIS — I350 Nonrheumatic aortic (valve) stenosis: Secondary | ICD-10-CM

## 2018-06-19 DIAGNOSIS — I248 Other forms of acute ischemic heart disease: Secondary | ICD-10-CM

## 2018-06-19 DIAGNOSIS — D649 Anemia, unspecified: Secondary | ICD-10-CM

## 2018-06-19 LAB — BASIC METABOLIC PANEL
Anion gap: 10 (ref 5–15)
BUN: 42 mg/dL — ABNORMAL HIGH (ref 8–23)
CO2: 30 mmol/L (ref 22–32)
Calcium: 9.3 mg/dL (ref 8.9–10.3)
Chloride: 97 mmol/L — ABNORMAL LOW (ref 98–111)
Creatinine, Ser: 1.23 mg/dL — ABNORMAL HIGH (ref 0.44–1.00)
GFR calc Af Amer: 43 mL/min — ABNORMAL LOW (ref >60)
GFR, EST NON AFRICAN AMERICAN: 38 mL/min — AB (ref >60)
Glucose, Bld: 133 mg/dL — ABNORMAL HIGH (ref 70–99)
Potassium: 4.3 mmol/L (ref 3.5–5.1)
Sodium: 137 mmol/L (ref 135–145)

## 2018-06-19 LAB — MAGNESIUM: Magnesium: 2.2 mg/dL (ref 1.7–2.4)

## 2018-06-19 MED ORDER — ENSURE ENLIVE PO LIQD
237.0000 mL | Freq: Two times a day (BID) | ORAL | Status: DC
  Administered 2018-06-20: 237 mL via ORAL

## 2018-06-19 MED ORDER — FUROSEMIDE 20 MG PO TABS: 20.0000 mg | ORAL_TABLET | Freq: Every day | ORAL | 1 refills | Status: DC

## 2018-06-19 MED ORDER — ASPIRIN 81 MG PO TBEC: 81.0000 mg | DELAYED_RELEASE_TABLET | Freq: Every day | ORAL | 2 refills | Status: DC

## 2018-06-19 MED ORDER — POTASSIUM CHLORIDE CRYS ER 20 MEQ PO TBCR: 20.0000 meq | EXTENDED_RELEASE_TABLET | Freq: Every day | ORAL | 0 refills | Status: DC

## 2018-06-19 MED ORDER — FUROSEMIDE 20 MG PO TABS: 20.0000 mg | ORAL_TABLET | Freq: Every day | ORAL | Status: DC

## 2018-06-19 NOTE — Progress Notes (Signed)
[employer] - [place] [facility]   PATIENT NAME: Natalie CostaHilda Sherman    MR#:  [id number]  DATE OF BIRTH:  09-03-1921  SUBJECTIVE:  Patient feels better, off O2.  REVIEW OF SYSTEMS:  CONSTITUTIONAL: No fever, fatigue or weakness.  EYES: No blurred or double vision.  EARS, NOSE, AND THROAT: No tinnitus or ear pain.  RESPIRATORY: No cough, shortness of breath, wheezing or hemoptysis.  CARDIOVASCULAR: No chest pain, orthopnea, edema.  GASTROINTESTINAL: No nausea, vomiting, diarrhea or abdominal pain.  GENITOURINARY: No dysuria, hematuria.  ENDOCRINE: No polyuria, nocturia,  HEMATOLOGY: No anemia, easy bruising or bleeding SKIN: No rash or lesion. MUSCULOSKELETAL: No joint pain or arthritis.   NEUROLOGIC: No tingling, numbness, weakness.  PSYCHIATRY: No anxiety or depression.   ROS  DRUG ALLERGIES:   Allergies  Allergen Reactions  . Penicillins Rash    DID THE REACTION INVOLVE: Swelling of the face/tongue/throat, SOB, or low BP? No Sudden or severe rash/hives, skin peeling, or the inside of the mouth or nose? No Did it require medical treatment? No When did it last happen?more than 10 years ago If all above answers are "NO", may proceed with cephalosporin use.     VITALS:  Blood pressure 113/61, pulse 63, temperature 98.2 F (36.8 C), temperature source Oral, resp. rate 18, weight 57.9 kg, SpO2 95 %.  PHYSICAL EXAMINATION:  GENERAL:  84 y.o.-year-old patient lying in the bed with no acute distress.  EYES: Pupils equal, round, reactive to light and accommodation. No scleral icterus. Extraocular muscles intact.  HEENT: Head atraumatic, normocephalic. Oropharynx and nasopharynx clear.  NECK:  Supple, no jugular venous distention. No thyroid enlargement, no tenderness.  LUNGS: Normal breath sounds bilaterally, no wheezing, mild rales, no rhonchi or crepitation. No use of accessory muscles of respiration.  CARDIOVASCULAR: S1, S2 normal. 4/6 systolic  murmurs, no rubs, or gallops.  ABDOMEN: Soft, nontender, nondistended. Bowel sounds present. No organomegaly or mass.  EXTREMITIES: No pedal edema, cyanosis, or clubbing.  NEUROLOGIC: Cranial nerves II through XII are intact. Muscle strength 5/5 in all extremities. Sensation intact. Gait not checked.  PSYCHIATRIC: The patient is alert and oriented x 3.  SKIN: No obvious rash, lesion, or ulcer.   Physical Exam LABORATORY PANEL:   CBC Recent Labs  Lab 06/17/18 0400  WBC 8.1  HGB 9.2*  HCT 28.9*  PLT 262   ------------------------------------------------------------------------------------------------------------------  Chemistries  Recent Labs  Lab 06/16/18 0829  06/19/18 0359  NA 138   < > 137  K 4.7   < > 4.3  CL 102   < > 97*  CO2 22   < > 30  GLUCOSE 191*   < > 133*  BUN 30*   < > 42*  CREATININE 1.27*   < > 1.23*  CALCIUM 9.6   < > 9.3  MG  --   --  2.2  AST 30  --   --   ALT 21  --   --   ALKPHOS 69  --   --   BILITOT 1.1  --   --    < > = values in this interval not displayed.   ------------------------------------------------------------------------------------------------------------------  Cardiac Enzymes Recent Labs  Lab 06/16/18 1810 06/16/18 2330  TROPONINI 0.13* 0.18*   ------------------------------------------------------------------------------------------------------------------  RADIOLOGY:  No results found.  ASSESSMENT AND PLAN:  84 year old elderly female patient with history of hypertension, hyperlipidemia presented to the emergency room for shortness of breath  *Acute diastolic congestive heart failure. She has been treated  with IV Lasix twice daily, beta-blocker therapy, aspirin, statin therapy, strict I&O monitoring, daily weights, Echocardiogram: EF 60-65%, severe aortic stenosis. To p.o. Lasix 40 mg daily per Dr. Mariah Milling.  *Severe aortic stenosis Per Dr. Mariah Milling, follow-up Dr. Excell Seltzer as an outpatient for possible TAVR, though she  would be high risk.  *Acute hypoxic respiratory failure Secondary to above Off oxygen, NEB prn.  *Chronic benign essential hypertension  Stable  Continue home regiment   *Acute elevated troponins Most likely secondary to demand ischemia from heart failure exacerbation Cardiac enzymes inconsistent with acute coronary syndrome.  Dehydration.  Possible due to Lasix, follow-up BMP.  Generalized weakness.  PT evaluation suggest home health and PT. I discussed with Dr. Mariah Milling. All the records are reviewed and case discussed with Care Management/Social Workerr. Management plans discussed with the patient, her daughter and they are in agreement.  CODE STATUS: dnr  TOTAL TIME TAKING CARE OF THIS PATIENT: 27 minutes.  POSSIBLE D/C IN 1 DAYS, DEPENDING ON CLINICAL CONDITION.   Shaune Pollack M.D on 06/19/2018   Between 7am to 6pm - Pager - [phone]  After 6pm go to [email or web address] - Social research officer, government  [facility] [place] [facility]  Office  [phone]  CC: Primary care physician; Lupita Raider, MD  Note: This dictation was prepared with Dragon dictation along with smaller phrase technology. Any transcriptional errors that result from this process are unintentional.

## 2018-06-19 NOTE — Care Management Important Message (Signed)
Copy of signed IM left with patient in room.  

## 2018-06-19 NOTE — Evaluation (Signed)
Physical Therapy Evaluation Patient Details Name: Natalie SchimkeHilda Q Sherman MRN: [id number] DOB: [birth year] Today's Date: 06/19/2018   History of Present Illness  Patient is a 84 year old female admitted from home with shortness of breath. Patient diagnosis of acute pulmonary edema. PMH to include: HTN, HLD    Clinical Impression  Patient received in bed requesting assistance to go to the bathroom. Patient with family present in room. Reports she lives alone at home, drives, rides stationary bike. Intermittent assistance from friends/family.  Agrees to ambulate. Cardiologist present during evaluation. O2 sats monitored throughout assessment. Patient ambulated around nurses station with rw and min guard. O2 sats remained at 92% or greater on room air throughout session. Patient may benefit from HHPT once discharged to improve endurance and ensure safety at home.         Follow Up Recommendations Home health PT;Supervision - Intermittent    Equipment Recommendations  None recommended by PT    Recommendations for Other Services       Precautions / Restrictions Precautions Precautions: Fall Restrictions Weight Bearing Restrictions: No      Mobility  Bed Mobility Overal bed mobility: Independent                Transfers Overall transfer level: Needs assistance Equipment used: Rolling walker (2 wheeled) Transfers: Sit to/from Stand Sit to Stand: Min guard         General transfer comment: slightly unsteady with initial standing balance.   Ambulation/Gait Ambulation/Gait assistance: Modified independent (Device/Increase time);Min guard Gait Distance (Feet): 150 Feet Assistive device: Rolling walker (2 wheeled) Gait Pattern/deviations: WFL(Within Functional Limits) Gait velocity: decreased   General Gait Details: patient without reports of fatigue or sob while ambulating. Reports ankle weakness bilaterally.  Stairs            Wheelchair Mobility    Modified Rankin  (Stroke Patients Only)       Balance Overall balance assessment: Modified Independent;Mild deficits observed, not formally tested                                           Pertinent Vitals/Pain Pain Assessment: No/denies pain    Home Living Family/patient expects to be discharged to:: Private residence Living Arrangements: Alone   Type of Home: House Home Access: Stairs to enter Entrance Stairs-Rails: Can reach both Entrance Stairs-Number of Steps: 3 Home Layout: One level Home Equipment: Environmental consultantWalker - 2 wheels      Prior Function                 Hand Dominance        Extremity/Trunk Assessment   Upper Extremity Assessment Upper Extremity Assessment: Overall WFL for tasks assessed    Lower Extremity Assessment Lower Extremity Assessment: Overall WFL for tasks assessed    Cervical / Trunk Assessment Cervical / Trunk Assessment: Kyphotic  Communication   Communication: HOH  Cognition Arousal/Alertness: Awake/alert Behavior During Therapy: WFL for tasks assessed/performed Overall Cognitive Status: Within Functional Limits for tasks assessed                                        General Comments      Exercises     Assessment/Plan    PT Assessment Patient needs continued PT services  PT Problem List Decreased  strength;Decreased activity tolerance       PT Treatment Interventions Gait training;Therapeutic activities;Patient/family education;Therapeutic exercise    PT Goals (Current goals can be found in the Care Plan section)  Acute Rehab PT Goals Patient Stated Goal: return home PT Goal Formulation: With patient/family Time For Goal Achievement: 07/03/18 Potential to Achieve Goals: Good    Frequency Min 2X/week   Barriers to discharge        Co-evaluation               AM-PAC PT "6 Clicks" Mobility  Outcome Measure Help needed turning from your back to your side while in a flat bed without using  bedrails?: A Little Help needed moving from lying on your back to sitting on the side of a flat bed without using bedrails?: A Little Help needed moving to and from a bed to a chair (including a wheelchair)?: A Little Help needed standing up from a chair using your arms (e.g., wheelchair or bedside chair)?: A Little Help needed to walk in hospital room?: A Little Help needed climbing 3-5 steps with a railing? : A Little 6 Click Score: 18    End of Session Equipment Utilized During Treatment: Gait belt Activity Tolerance: Patient tolerated treatment well Patient left: in chair;with bed alarm set;with family/visitor present;with call bell/phone within reach Nurse Communication: Mobility status PT Visit Diagnosis: Unsteadiness on feet (R26.81);Muscle weakness (generalized) (M62.81)    Time: 0950-1030 PT Time Calculation (min) (ACUTE ONLY): 40 min   Charges:   PT Evaluation $PT Eval Moderate Complexity: 1 Mod PT Treatments $Gait Training: 8-22 mins $Therapeutic Activity: 8-22 mins        Romilda Proby, PT, GCS 06/19/18,11:09 AM

## 2018-06-19 NOTE — Progress Notes (Signed)
Heart Failure Counseling  Discussed with patient and 2 family members about heart failure, medications (atenolol, furosemide), exercise, daily weights, smoke avoidance, S/S to monitor, and reduction of salt intake. Patient is very active and takes few PTA medications (blood pressure and hyperlipidemia medications). Patient and family understanding and accepting.   Mauri Reading, PharmD Pharmacy Resident  06/19/2018 2:16 PM

## 2018-06-19 NOTE — Progress Notes (Signed)
Progress Note  Patient Name: Natalie Sherman Date of Encounter: 06/19/2018  Primary Cardiologist: [facility], Dr. Kirke CorinArida  Subjective   Reports that she feels well Denies any significant shortness of breath at rest or supine Walked around the nursing station with a walker and physical therapy monitoring oxygen saturations Remained 93% or higher heart rate in the low 90s Reports that she felt well Family at the bedside, long discussion concerning her aortic valve disease  Inpatient Medications    Scheduled Meds: . aspirin EC  81 mg Oral Daily  . atenolol  50 mg Oral Daily  . calcium carbonate  3 tablet Oral Daily  . cholecalciferol  1,000 Units Oral Daily  . furosemide  40 mg Intravenous BID  . heparin  5,000 Units Subcutaneous Q8H  . multivitamin with minerals  1 tablet Oral Daily  . potassium chloride  20 mEq Oral Daily  . simvastatin  20 mg Oral Daily  . sodium chloride flush  3 mL Intravenous Q12H  . vitamin C  250 mg Oral Daily  . vitamin E  400 Units Oral Daily   Continuous Infusions: . sodium chloride     PRN Meds: sodium chloride, acetaminophen **OR** acetaminophen, HYDROcodone-acetaminophen, ondansetron **OR** ondansetron (ZOFRAN) IV, senna-docusate, sodium chloride flush   Vital Signs    Vitals:   06/18/18 1624 06/18/18 2021 06/19/18 0405 06/19/18 0733  BP: (!) 118/52 (!) 112/46 (!) 111/52 (!) 107/48  Pulse: (!) 54 60 (!) 57 63  Resp: 20 18 18 18   Temp: 98.1 F (36.7 C) 98.7 F (37.1 C) (!) 97.3 F (36.3 C) 98.2 F (36.8 C)  TempSrc: Oral Oral Oral Oral  SpO2: 99% 90% 95% 98%  Weight:   57.9 kg     Intake/Output Summary (Last 24 hours) at 06/19/2018 1010 Last data filed at 06/19/2018 0959 Gross per 24 hour  Intake 120 ml  Output 700 ml  Net -580 ml   Filed Weights   06/17/18 0506 06/18/18 0635 06/19/18 0405  Weight: 60.1 kg 59.6 kg 57.9 kg    Telemetry    Sinus rhythm- Personally Reviewed  ECG    None new- Personally  Reviewed  Physical Exam   Constitutional:  oriented to person, place, and time. No distress.  HENT:  Head: Grossly normal Eyes:  no discharge. No scleral icterus.  Neck: No JVD, no carotid bruits  Cardiovascular: RRR; 3 out of 6 SEM, no rubs, or gallops,no edema  Pulmonary/Chest: Clear to auscultation bilaterally, no wheezes or rails Abdominal: Soft.  no distension.  no tenderness.  Musculoskeletal: Normal range of motion Neurological:  normal muscle tone. Coordination normal. No atrophy Skin: Skin warm and dry Psychiatric: normal affect, pleasant    Labs    Chemistry Recent Labs  Lab 06/16/18 0829 06/17/18 0400 06/18/18 1413 06/19/18 0359  NA 138 136 137 137  K 4.7 3.5 4.2 4.3  CL 102 102 95* 97*  CO2 22 25 30 30   GLUCOSE 191* 120* 154* 133*  BUN 30* 32* 38* 42*  CREATININE 1.27* 1.23* 1.25* 1.23*  CALCIUM 9.6 8.4* 9.5 9.3  PROT 7.6  --   --   --   ALBUMIN 3.9  --   --   --   AST 30  --   --   --   ALT 21  --   --   --   ALKPHOS 69  --   --   --   BILITOT 1.1  --   --   --  GFRNONAA 36* 38* 37* 38*  GFRAA 42* 43* 43* 43*  ANIONGAP 14 9 12 10      Hematology Recent Labs  Lab 06/16/18 0829 06/17/18 0400  WBC 11.0* 8.1  RBC 3.93 3.14*  HGB 11.6* 9.2*  HCT 36.5 28.9*  MCV 92.9 92.0  MCH 29.5 29.3  MCHC 31.8 31.8  RDW 13.6 13.6  PLT 349 262    Cardiac Enzymes Recent Labs  Lab 06/16/18 0829 06/16/18 1217 06/16/18 1810 06/16/18 2330  TROPONINI 0.05* 0.07* 0.13* 0.18*   No results for input(s): TROPIPOC in the last 168 hours.   BNP Recent Labs  Lab 06/16/18 0828  BNP 1,541.0*     DDimer No results for input(s): DDIMER in the last 168 hours.   Radiology    No results found.  Cardiac Studies   06/17/2018 Left ventricle: The cavity size was normal. Wall thickness was   increased in a pattern of moderate LVH. There was moderate   concentric hypertrophy. Systolic function was normal. The   estimated ejection fraction was in the range of  60% to 65%. Wall   motion was normal; there were no regional wall motion   abnormalities. Doppler parameters are consistent with abnormal   left ventricular relaxation (grade 1 diastolic dysfunction). - Aortic valve: There was mild regurgitation. Valve area (VTI):   0.52 cm^2. Valve area (Vmax): 0.42 cm^2. Valve area (Vmean): 0.52   cm^2. - Mitral valve: There was moderate regurgitation. Impressions: - Normal LVF   Normal Wall Motion   EF=65%   Normal Right side   Mod LVH   Critical AS   Valve Area<0.5cm2. Normal study.  12/2005 Carotid Artery B/l US IMPRESSION:  Bilateral carotid bifurcation plaque without hemodynamically significant stenosis. Continued surveillance recommended.  Patient Profile     84 y.o. female with a history of hypertension and hyperlipidemia who presented to the hospital with CHF.  Assessment & Plan    Acute HFpEF:  - 12/28 TTE shows a normal ejection fraction 65%,   critical aortic stenosis, thought to be the most likely cause of her acute HFpEF.  -Creatinine and BUN starting to climb, would hold Lasix IV changed to Lasix 20 mg daily Blood pressure soft today  Critical Aortic Stenosis:   acute HFpEF Long discussion with family concerning various treatment options for her aortic valve " I want to live" She is interested in pursuing work-up for TAVR Currently lives independently, still drives does her own ADLs We did discuss the process including need for cardiac catheterization, office visits, CT scan Discussed cardiac catheterization risk and benefit in detail Discussed reason for CT scan in detail Family is aware and will help facilitate rides to [facility] Son-in-law will help, his name is Mr. Ruthetta Zeagler phone #[phone]  Acute respiratory failure with hypoxia Appearing mildly prerenal We will back down on the Lasix Would discharge home on Lasix 20 daily  Troponin elevation Troponin 12/27 0.05  0.13  0.18. Likely supply demand  ischemia If she is to pursue TAVR,  she would need catheterization  AKI We will back off on the Lasix Continue gentle diuresis at home  HTN Running low, will need to monitor closely  continue BB and lasix.  HLD  simvastatin   Anemia Drop from 11.6 down to 9.2 Repeat pending  Hyperglycemia - A1C pending Glucose levels ranging from 120 up to 190s past 3 days  Long discussion with patient and family concerning TAVR Including work-up for TAVR including cardiac catheterization risk and benefit,  CT scans, meeting with several physicians as part of the team in [facility]  Total encounter time more than 45 minutes  Greater than 50% was spent in counseling and coordination of care with the patient   For questions or updates, please contact [facility] Please consult [email or web address] for contact info under        Signed, Lennon Alstrom, PA-C  06/19/2018, 10:10 AM

## 2018-06-20 LAB — BASIC METABOLIC PANEL
Anion gap: 11 (ref 5–15)
BUN: 50 mg/dL — ABNORMAL HIGH (ref 8–23)
CO2: 29 mmol/L (ref 22–32)
Calcium: 9.4 mg/dL (ref 8.9–10.3)
Chloride: 98 mmol/L (ref 98–111)
Creatinine, Ser: 1.16 mg/dL — ABNORMAL HIGH (ref 0.44–1.00)
GFR calc non Af Amer: 40 mL/min — ABNORMAL LOW (ref >60)
GFR, EST AFRICAN AMERICAN: 47 mL/min — AB (ref >60)
Glucose, Bld: 131 mg/dL — ABNORMAL HIGH (ref 70–99)
Potassium: 4.3 mmol/L (ref 3.5–5.1)
SODIUM: 138 mmol/L (ref 135–145)

## 2018-06-20 LAB — HEMOGLOBIN: Hemoglobin: 10.7 g/dL — ABNORMAL LOW (ref 12.0–15.0)

## 2018-06-20 MED ORDER — ASPIRIN 81 MG PO TBEC: 81.0000 mg | DELAYED_RELEASE_TABLET | Freq: Every day | ORAL | 2 refills | Status: AC

## 2018-06-20 NOTE — Discharge Summary (Signed)
[facility] [place] [facility]   PATIENT NAME: Natalie Sherman    MR#:  [id number]  DATE OF BIRTH:  07-09-1921  DATE OF ADMISSION:  06/16/2018   ADMITTING PHYSICIAN: Ihor Austin, MD  DATE OF DISCHARGE: 06/20/2018 11:49 AM  PRIMARY CARE PHYSICIAN: Lupita Raider, MD   ADMISSION DIAGNOSIS:  Acute pulmonary edema (HCC) [J81.0] Acute congestive heart failure, unspecified heart failure type (HCC) [I50.9] DISCHARGE DIAGNOSIS:  Active Problems:   Acute pulmonary edema (HCC)  SECONDARY DIAGNOSIS:   Past Medical History:  Diagnosis Date  . Hyperlipidemia   . Hypertension    HOSPITAL COURSE:   84 year old elderly female patient with history of hypertension, hyperlipidemia presented to the emergency room for shortness of breath  *Acute diastolic congestive heart failure. She has been treated with IV Lasix twice daily, beta-blocker therapy, aspirin, statin therapy, strict I&O monitoring, daily weights, Echocardiogram: EF 60-65%, severe aortic stenosis. p.o. Lasix 40 mg daily per Dr. Mariah Milling. Since the patient is dehydrated, hold Lasix today and restart with 20 mg p.o. daily tomorrow.  Follow-up with Dr. Mariah Milling in 1 week.  *Severe aortic stenosis Per Dr. Mariah Milling, follow-up Dr. Excell Seltzer as an outpatient for possible TAVR, though she would be high risk.  *Acute hypoxic respiratory failure Secondary to above Off oxygen, NEB prn.  *Chronic benign essential hypertension  Stable  Continue home regiment   *Acute elevated troponins Most likely secondary to demand ischemia from heart failure exacerbation Cardiac enzymes inconsistent with acute coronary syndrome.  Dehydration.  Possible due to Lasix, follow-up BMP as outpatient.  Generalized weakness.  PT evaluation suggest home health and PT. The patient and the family members refused home health. DISCHARGE CONDITIONS:  Stable, discharged to home today. CONSULTS OBTAINED:  Treatment Team:  Iran Ouch, MD DRUG ALLERGIES:   Allergies  Allergen Reactions  . Penicillins Rash    DID THE REACTION INVOLVE: Swelling of the face/tongue/throat, SOB, or low BP? No Sudden or severe rash/hives, skin peeling, or the inside of the mouth or nose? No Did it require medical treatment? No When did it last happen?more than 10 years ago If all above answers are "NO", may proceed with cephalosporin use.    DISCHARGE MEDICATIONS:   Allergies as of 06/20/2018      Reactions   Penicillins Rash   DID THE REACTION INVOLVE: Swelling of the face/tongue/throat, SOB, or low BP? No Sudden or severe rash/hives, skin peeling, or the inside of the mouth or nose? No Did it require medical treatment? No When did it last happen?more than 10 years ago If all above answers are "NO", may proceed with cephalosporin use.      Medication List    STOP taking these medications   amLODipine 10 MG tablet Commonly known as:  NORVASC   HYDROcodone-acetaminophen 5-325 MG tablet Commonly known as:  NORCO/VICODIN   Influenza vac split quadrivalent PF 0.5 ML injection Commonly known as:  FLUZONE HIGH-DOSE     TAKE these medications   aspirin 81 MG EC tablet Take 1 tablet (81 mg total) by mouth daily.   atenolol 50 MG tablet Commonly known as:  TENORMIN Take 1 tablet by mouth daily.   calcium carbonate 1500 (600 Ca) MG Tabs tablet Commonly known as:  OSCAL Take 1,500 mg by mouth daily.   cholecalciferol 25 MCG (1000 UT) tablet Commonly known as:  VITAMIN D3 Take 1,000 Units by mouth daily.   furosemide 20 MG tablet Commonly known as:  LASIX Take 1  tablet (20 mg total) by mouth daily.   multivitamin with minerals Tabs tablet Take 1 tablet by mouth daily.   potassium chloride SA 20 MEQ tablet Commonly known as:  K-DUR,KLOR-CON Take 1 tablet (20 mEq total) by mouth daily.   PRESERVISION/LUTEIN Caps Take 1 capsule by mouth daily.   simvastatin 20 MG tablet Commonly known as:   ZOCOR Take 1 tablet by mouth daily.   vitamin C 250 MG tablet Commonly known as:  ASCORBIC ACID Take 250 mg by mouth daily.   vitamin E 400 UNIT capsule Take 400 Units by mouth daily.        DISCHARGE INSTRUCTIONS:  See AVS.  If you experience worsening of your admission symptoms, develop shortness of breath, life threatening emergency, suicidal or homicidal thoughts you must seek medical attention immediately by calling 911 or calling your MD immediately  if symptoms less severe.  You Must read complete instructions/literature along with all the possible adverse reactions/side effects for all the Medicines you take and that have been prescribed to you. Take any new Medicines after you have completely understood and accpet all the possible adverse reactions/side effects.   Please note  You were cared for by a hospitalist during your hospital stay. If you have any questions about your discharge medications or the care you received while you were in the hospital after you are discharged, you can call the unit and asked to speak with the hospitalist on call if the hospitalist that took care of you is not available. Once you are discharged, your primary care physician will handle any further medical issues. Please note that NO REFILLS for any discharge medications will be authorized once you are discharged, as it is imperative that you return to your primary care physician (or establish a relationship with a primary care physician if you do not have one) for your aftercare needs so that they can reassess your need for medications and monitor your lab values.    On the day of Discharge:  VITAL SIGNS:  Blood pressure 115/61, pulse (!) 56, temperature 98 F (36.7 C), temperature source Oral, resp. rate 18, height 5\' 3"  (1.6 m), weight 57.3 kg, SpO2 95 %. PHYSICAL EXAMINATION:  GENERAL:  84 y.o.-year-old patient lying in the bed with no acute distress.  EYES: Pupils equal, round, reactive to  light and accommodation. No scleral icterus. Extraocular muscles intact.  HEENT: Head atraumatic, normocephalic. Oropharynx and nasopharynx clear.  NECK:  Supple, no jugular venous distention. No thyroid enlargement, no tenderness.  LUNGS: Normal breath sounds bilaterally, no wheezing, rales,rhonchi or crepitation. No use of accessory muscles of respiration.  CARDIOVASCULAR: S1, S2 normal. 4/5 systolic murmurs, no rubs, or gallops.  ABDOMEN: Soft, non-tender, non-distended. Bowel sounds present. No organomegaly or mass.  EXTREMITIES: No pedal edema, cyanosis, or clubbing.  NEUROLOGIC: Cranial nerves II through XII are intact. Muscle strength 5/5 in all extremities. Sensation intact. Gait not checked.  PSYCHIATRIC: The patient is alert and oriented x 3.  SKIN: No obvious rash, lesion, or ulcer.  DATA REVIEW:   CBC Recent Labs  Lab 06/17/18 0400 06/20/18 0459  WBC 8.1  --   HGB 9.2* 10.7*  HCT 28.9*  --   PLT 262  --     Chemistries  Recent Labs  Lab 06/16/18 0829  06/19/18 0359 06/20/18 0459  NA 138   < > 137 138  K 4.7   < > 4.3 4.3  CL 102   < > 97* 98  CO2 22   < > 30 29  GLUCOSE 191*   < > 133* 131*  BUN 30*   < > 42* 50*  CREATININE 1.27*   < > 1.23* 1.16*  CALCIUM 9.6   < > 9.3 9.4  MG  --   --  2.2  --   AST 30  --   --   --   ALT 21  --   --   --   ALKPHOS 69  --   --   --   BILITOT 1.1  --   --   --    < > = values in this interval not displayed.     Microbiology Results  No results found for this or any previous visit.  RADIOLOGY:  No results found.   Management plans discussed with the patient, her son and they are in agreement.  CODE STATUS: DNR   TOTAL TIME TAKING CARE OF THIS PATIENT: 33 minutes.    Shaune PollackQing London Tarnowski M.D on 06/20/2018 at 1:59 PM  Between 7am to 6pm - Pager - [phone]  After 6pm go to [email or web address] - Scientist, research (life sciences)password [email or web address]  [facility] [place] [facility]  Office  [phone]  CC: Primary care physician; Lupita RaiderShaw,  Kimberlee, MD   Note: This dictation was prepared with Dragon dictation along with smaller phrase technology. Any transcriptional errors that result from this process are unintentional.

## 2018-06-20 NOTE — Care Management (Signed)
Patient is discharging today with home health.  Rosey Bath with [facility] has been notified.

## 2018-06-20 NOTE — Care Management Note (Signed)
Case Management Note  Patient Details  Name: Natalie Sherman MRN: [id number] Date of Birth: [birth year]  Subjective/Objective:       Pa\tient and family now refusing home health.  States they have a family member who is a Engineer, civil (consulting) and will not need home health services.  Rosey Bath with [facility] is aware.            Action/Plan:   Expected Discharge Date:  06/20/18               Expected Discharge Plan:  Home w Home Health Services  In-House Referral:     Discharge planning Services  CM Consult  Post Acute Care Choice:    Choice offered to:     DME Arranged:    DME Agency:     HH Arranged:    HH Agency:     Status of Service:  Completed, signed off  If discussed at [employer], dates discussed:    Additional Comments:  Sherren Kerns, RN 06/20/2018, 11:17 AM

## 2018-06-20 NOTE — Discharge Instructions (Signed)

## 2018-06-20 NOTE — Progress Notes (Signed)
[name] to be D/C'd Home per MD order. Patient given discharge teaching and paperwork regarding medications, diet, follow-up appointments and activity. Patient understanding verbalized. No questions or complaints at this time. Skin condition as charted. IV and telemetry removed prior to leaving.  No further needs by Care Management/Social Work. Prescriptions given to patient.  An After Visit Summary was printed and given to the patient.   Patient escorted via wheelchair by volunteer to ride home with family.   Clayborne Dana

## 2018-06-20 NOTE — Plan of Care (Signed)
  Problem: Activity: Goal: Risk for activity intolerance will decrease Outcome: Progressing Note:  Up with standby assist to [facility], tolerating well   Problem: Pain Managment: Goal: General experience of comfort will improve Outcome: Progressing Note:  No complaints of pain this shift   Problem: Safety: Goal: Ability to remain free from injury will improve Outcome: Progressing   Problem: Skin Integrity: Goal: Risk for impaired skin integrity will decrease Outcome: Progressing   Problem: Education: Goal: Knowledge of General Education information will improve Description Including pain rating scale, medication(s)/side effects and non-pharmacologic comfort measures Outcome: Completed/Met   Problem: Clinical Measurements: Goal: Respiratory complications will improve Outcome: Completed/Met   Problem: Nutrition: Goal: Adequate nutrition will be maintained Outcome: Completed/Met   Problem: Coping: Goal: Level of anxiety will decrease Outcome: Completed/Met   Problem: Elimination: Goal: Will not experience complications related to urinary retention Outcome: Completed/Met

## 2018-06-20 NOTE — Evaluation (Signed)
Physical Therapy Evaluation Patient Details Name: Natalie Sherman MRN: [id number] DOB: March 31, 1922 Today's Date: 06/20/2018   History of Present Illness  Patient is a 84 year old female admitted from home with shortness of breath. Patient diagnosis of acute pulmonary edema. PMH to include: HTN, HLD    Clinical Impression  Patient reports she is leaving today. Reports she is feeling a little weak. Ambulated around nursing station x2, ~300 feet, with rw and supervision. Good cadence. Slight fatigue after walking however HR and O2 sats WNL on room air.  Patient will benefit from continued PT until discharged to improve strength.        Follow Up Recommendations Home health PT;Supervision - Intermittent    Equipment Recommendations  None recommended by PT    Recommendations for Other Services       Precautions / Restrictions Precautions Precautions: Fall Restrictions Weight Bearing Restrictions: No      Mobility  Bed Mobility Overal bed mobility: Independent                Transfers Overall transfer level: Modified independent Equipment used: Rolling walker (2 wheeled) Transfers: Sit to/from Stand Sit to Stand: Supervision            Ambulation/Gait Ambulation/Gait assistance: Modified independent (Device/Increase time);Supervision Gait Distance (Feet): 300 Feet Assistive device: Rolling walker (2 wheeled) Gait Pattern/deviations: WFL(Within Functional Limits) Gait velocity: WNL   General Gait Details: patient without reports of fatigue or sob while ambulating. Reports ankle/leg weakness bilaterally.  Stairs            Wheelchair Mobility    Modified Rankin (Stroke Patients Only)       Balance Overall balance assessment: Modified Independent                                           Pertinent Vitals/Pain Pain Assessment: No/denies pain    Home Living Family/patient expects to be discharged to:: Private residence Living  Arrangements: Alone Available Help at Discharge: Family;Available PRN/intermittently;Friend(s) Type of Home: House Home Access: Stairs to enter Entrance Stairs-Rails: Can reach both Entrance Stairs-Number of Steps: 3 Home Layout: One level Home Equipment: Walker - 2 wheels      Prior Function                 Hand Dominance        Extremity/Trunk Assessment   Upper Extremity Assessment Upper Extremity Assessment: Overall WFL for tasks assessed    Lower Extremity Assessment Lower Extremity Assessment: Overall WFL for tasks assessed    Cervical / Trunk Assessment Cervical / Trunk Assessment: Kyphotic  Communication   Communication: HOH  Cognition Arousal/Alertness: Awake/alert Behavior During Therapy: WFL for tasks assessed/performed Overall Cognitive Status: Within Functional Limits for tasks assessed                                        General Comments      Exercises     Assessment/Plan    PT Assessment Patient needs continued PT services  PT Problem List Decreased strength;Decreased activity tolerance       PT Treatment Interventions Gait training;Functional mobility training;Therapeutic exercise;Therapeutic activities;Patient/family education    PT Goals (Current goals can be found in the Care Plan section)  Acute Rehab PT Goals Patient Stated Goal:  return home PT Goal Formulation: With patient/family Time For Goal Achievement: 07/03/18 Potential to Achieve Goals: Good    Frequency Min 2X/week   Barriers to discharge        Co-evaluation               AM-PAC PT "6 Clicks" Mobility  Outcome Measure Help needed turning from your back to your side while in a flat bed without using bedrails?: A Little Help needed moving from lying on your back to sitting on the side of a flat bed without using bedrails?: A Little Help needed moving to and from a bed to a chair (including a wheelchair)?: A Little Help needed standing  up from a chair using your arms (e.g., wheelchair or bedside chair)?: A Little Help needed to walk in hospital room?: A Little Help needed climbing 3-5 steps with a railing? : A Little 6 Click Score: 18    End of Session Equipment Utilized During Treatment: Gait belt Activity Tolerance: Patient tolerated treatment well Patient left: in bed;with family/visitor present Nurse Communication: Mobility status PT Visit Diagnosis: Muscle weakness (generalized) (M62.81)    Time: 8979-1504 PT Time Calculation (min) (ACUTE ONLY): 30 min   Charges:   PT Evaluation $PT Eval Low Complexity: 1 Low PT Treatments $Gait Training: 8-22 mins $Therapeutic Activity: 8-22 mins        Elvan Ebron, PT, GCS 06/20/18,12:10 PM

## 2018-06-28 NOTE — Progress Notes (Signed)
Cardiology Office Note Date:  06/29/2018  Patient ID:  Juna, Wier 01/20/1922, MRN [id number] PCP:  Lupita Raider, MD  Cardiologist:  Dr. Kirke Corin, MD    Chief Complaint: Hospital follow up   History of Present Illness: EKAM KUI is a 85 y.o. female with history of chronic diastolic CHF, severe aortic stenosis, hypertension, and hyperlipidemia who presents for hospital follow-up after recent admission from 12/27 through 12/31 for acute diastolic CHF exacerbated by severe/critical aortic stenosis.  Prior to the above admission, the patient had never seen a cardiologist.  Patient did report a remote echo done through the PCPs office for a murmur though were uncertain of the details.  She was admitted to [facility] in late December, 2019 with a one-week history of increased shortness of breath, lower extremity swelling, and orthopnea.  She was found to have acute diastolic CHF and critical aortic stenosis.  Initial labs showed a BNP of 1541, minimal troponin elevation with a peak of 0.18 felt to be demand ischemia, EKG showing sinus rhythm with nonspecific inferolateral changes, and chest x-ray showing diffuse interstitial and airspace pulmonary edema with small bilateral pleural effusions.  Echo on 06/17/2018 showed an EF of 60 to 65%, moderate LVH, no regional wall motion abnormalities, grade 1 diastolic dysfunction, severe aortic stenosis with a valve area less than 0.5 cm, mild aortic insufficiency, moderate mitral regurgitation.  She was interested in pursuing intervention on her aortic valve in follow-up.  Symptoms improved with IV diuresis.    Discharge medications: Medication List    STOP taking these medications   amLODipine 10 MG tablet Commonly known as:  NORVASC   HYDROcodone-acetaminophen 5-325 MG tablet Commonly known as:  NORCO/VICODIN   Influenza vac split quadrivalent PF 0.5 ML injection Commonly known as:  FLUZONE HIGH-DOSE     TAKE these medications     aspirin 81 MG EC tablet Take 1 tablet (81 mg total) by mouth daily.   atenolol 50 MG tablet Commonly known as:  TENORMIN Take 1 tablet by mouth daily.   calcium carbonate 1500 (600 Ca) MG Tabs tablet Commonly known as:  OSCAL Take 1,500 mg by mouth daily.   cholecalciferol 25 MCG (1000 UT) tablet Commonly known as:  VITAMIN D3 Take 1,000 Units by mouth daily.   furosemide 20 MG tablet Commonly known as:  LASIX Take 1 tablet (20 mg total) by mouth daily.   multivitamin with minerals Tabs tablet Take 1 tablet by mouth daily.   potassium chloride SA 20 MEQ tablet Commonly known as:  K-DUR,KLOR-CON Take 1 tablet (20 mEq total) by mouth daily.   PRESERVISION/LUTEIN Caps Take 1 capsule by mouth daily.   simvastatin 20 MG tablet Commonly known as:  ZOCOR Take 1 tablet by mouth daily.   vitamin C 250 MG tablet Commonly known as:  ASCORBIC ACID Take 250 mg by mouth daily.   vitamin E 400 UNIT capsule Take 400 Units by mouth daily.    Documented discharge weight: 57.3 kg.  Discharge labs: Hgb 10.7, potassium 4.3, serum creatinine 1.16, magnesium 2.2, LDL 72, AST/ALT normal, A1c 5.9  She comes in accompanied by her daughter and son-in-law today.  She is doing very well from a cardiac perspective.  No chest pain, shortness of breath, palpitations, dizziness, presyncope, or syncope.  No lower extremity swelling, abdominal distention, orthopnea, PND, or early satiety.  She does note a "swimmy headed" feeling sometimes after taking Lasix.  She is very interested in moving forward with potential intervention  of her aortic stenosis.  The family corroborates this stating that is all they have heard since she left the hospital.  Patient states "let's do it tomorrow."  Blood pressure, heart rate, and weight have remained well controlled at home since her discharge.  No falls.  No BRBPR or melena.  Energy is back to baseline.  Outside of moving forward with evaluation for her  aortic stenosis she does not have any concerns today.   Past Medical History:  Diagnosis Date  . Chronic diastolic CHF (congestive heart failure) (HCC)    a. EF of 60 to 65%, moderate LVH, no regional wall motion abnormalities, grade 1 diastolic dysfunction, severe aortic stenosis with a valve area less than 0.5 cm, mild aortic insufficiency, moderate mitral regurgitation  . Hyperlipidemia   . Hypertension   . Severe aortic stenosis     Past Surgical History:  Procedure Laterality Date  . DILATION AND CURETTAGE OF UTERUS  1968    Current Meds  Medication Sig  . amLODipine (NORVASC) 10 MG tablet Take 10 mg by mouth daily.  Marland Kitchen. aspirin 81 MG EC tablet Take 1 tablet (81 mg total) by mouth daily.  Marland Kitchen. atenolol (TENORMIN) 50 MG tablet Take 1 tablet by mouth daily.  . Cholecalciferol (HM VITAMIN D3) 50 MCG (2000 UT) CAPS Take 1 capsule by mouth daily.  . cholecalciferol (VITAMIN D3) 25 MCG (1000 UT) tablet Take 1,000 Units by mouth daily.  . furosemide (LASIX) 20 MG tablet Take 1 tablet (20 mg total) by mouth daily.  . magnesium oxide (MAG-OX) 400 MG tablet Take 400 mg by mouth daily.  . Multiple Vitamin (MULTIVITAMIN WITH MINERALS) TABS tablet Take 1 tablet by mouth daily.  . Multiple Vitamins-Minerals (PRESERVISION/LUTEIN) CAPS Take 1 capsule by mouth daily.  . Omega-3 Fatty Acids (FISH OIL) 1000 MG CAPS Take 2 capsules by mouth daily.  . potassium chloride SA (K-DUR,KLOR-CON) 20 MEQ tablet Take 1 tablet (20 mEq total) by mouth daily.  . simvastatin (ZOCOR) 20 MG tablet Take 1 tablet by mouth daily.  . vitamin C (ASCORBIC ACID) 250 MG tablet Take 250 mg by mouth daily.  . vitamin E 400 UNIT capsule Take 400 Units by mouth daily.  . Zinc Sulfate (ZINC 15 PO) Take 1 tablet by mouth daily.    Allergies:   Penicillins   Social History:  The patient  reports that she has never smoked. She has never used smokeless tobacco. She reports that she does not drink alcohol or use drugs.   Family  History:  The patient's family history includes COPD in her father; Diabetes Mellitus II in her mother; Heart disease in her father.  ROS:   Review of Systems  Constitutional: Negative for chills, diaphoresis, fever, malaise/fatigue and weight loss.  HENT: Negative for congestion.   Eyes: Negative for discharge and redness.  Respiratory: Negative for cough, hemoptysis, sputum production, shortness of breath and wheezing.   Cardiovascular: Negative for chest pain, palpitations, orthopnea, claudication, leg swelling and PND.  Gastrointestinal: Negative for abdominal pain, blood in stool, heartburn, melena, nausea and vomiting.  Genitourinary: Negative for hematuria.  Musculoskeletal: Negative for falls and myalgias.  Skin: Negative for rash.  Neurological: Negative for dizziness, tingling, tremors, sensory change, speech change, focal weakness, loss of consciousness and weakness.  Endo/Heme/Allergies: Does not bruise/bleed easily.  Psychiatric/Behavioral: Negative for substance abuse. The patient is not nervous/anxious.   All other systems reviewed and are negative.    PHYSICAL EXAM:  VS:  BP 134/78 (BP  Location: Left Arm, Patient Position: Sitting, Cuff Size: Normal)   Pulse (!) 57   Ht 5\' 2"  (1.575 m)   Wt 134 lb (60.8 kg)   BMI 24.51 kg/m  BMI: Body mass index is 24.51 kg/m.  Physical Exam  Constitutional: She is oriented to person, place, and time. She appears well-developed and well-nourished.  HENT:  Head: Normocephalic and atraumatic.  Eyes: Right eye exhibits no discharge. Left eye exhibits no discharge.  Neck: Normal range of motion. No JVD present.  Cardiovascular: Normal rate, regular rhythm, S1 normal and S2 normal. Exam reveals no distant heart sounds, no friction rub, no midsystolic click and no opening snap.  Murmur heard.  Harsh midsystolic murmur is present with a grade of 3/6 at the upper right sternal border radiating to the neck. Pulses:      Posterior tibial  pulses are 2+ on the right side and 2+ on the left side.  Pulmonary/Chest: Effort normal and breath sounds normal. No respiratory distress. She has no decreased breath sounds. She has no wheezes. She has no rales. She exhibits no tenderness.  Abdominal: Soft. She exhibits no distension. There is no abdominal tenderness.  Musculoskeletal:        General: No edema.  Neurological: She is alert and oriented to person, place, and time.  Skin: Skin is warm and dry. No cyanosis. Nails show no clubbing.  Psychiatric: She has a normal mood and affect. Her speech is normal and behavior is normal. Judgment and thought content normal.     EKG:  Was ordered and interpreted by me today. Shows sinus bradycardia, 57 bpm, left axis deviation, LVH with repolarization abnormality, poor R wave progression along the precordial leads, no acute ST-T changes  Recent Labs: 06/16/2018: ALT 21; B Natriuretic Peptide 1,541.0 06/17/2018: Platelets 262 06/19/2018: Magnesium 2.2 06/20/2018: BUN 50; Creatinine, Ser 1.16; Hemoglobin 10.7; Potassium 4.3; Sodium 138  06/17/2018: Cholesterol 120; HDL 25; LDL Cholesterol 72; Total CHOL/HDL Ratio 4.8; Triglycerides 117; VLDL 23   Estimated Creatinine Clearance: 25.5 mL/min (A) (by C-G formula based on SCr of 1.16 mg/dL (H)).   Wt Readings from Last 3 Encounters:  06/29/18 134 lb (60.8 kg)  06/29/18 134 lb 6 oz (61 kg)  06/20/18 126 lb 6.4 oz (57.3 kg)     Other studies reviewed: Additional studies/records reviewed today include: summarized above  ASSESSMENT AND PLAN:  1. Severe aortic stenosis: She has done quite well since her discharge, without chest pain, dizziness, presyncope, or syncope.  Long discussion with the patient and family today regarding her diagnosis of severe aortic stenosis and the beginning stages of work-up for invasive intervention.  Patient is a very eager to move forward with valve replacement.  In this setting, we have scheduled her to undergo  right and left cardiac catheterization with Dr. Excell Seltzerooper in [place] as part of her potential TAVR work-up as well as in the setting of her elevated troponin as outlined below.  She has been made aware there are still several more steps and appointments with physicians that need to be undertaken prior to a formal/group decision being made.  Patient and family are aware she would be high risk for any intervention.  Both the patient and family are aware of this and are appreciative.  2. Elevated troponin: Felt to be supply demand ischemia in the setting of volume overload with acute diastolic CHF exacerbated by severe aortic stenosis.  Echo demonstrated preserved LV systolic function as outlined above.  Plan for right  and left cardiac cath as outlined above given the patient's strong desire to pursue invasive intervention of her aortic stenosis.  Continue aspirin 81 mg daily, atenolol, and simvastatin.  Risks and benefits of cardiac catheterization have been discussed with the patient including risks of bleeding, bruising, infection, kidney damage, stroke, heart attack, urgent need for bypass surgery, and death. The patient understands these risks and is willing to proceed with the procedure. All questions have been answered and concerns listened to.   3. Chronic diastolic CHF: She appears well compensated and euvolemic.  Her diastolic heart failure exacerbation was likely in the setting of severe aortic stenosis.  Continue Lasix 20 mg daily.  Check BMP.  4. Hypertension: Blood pressure is reasonably controlled today and has been well controlled at home.  Continue Norvasc, atenolol, and Lasix.  5. Hyperlipidemia: LDL of 72 from 05/2018.  Remains on low-dose simvastatin.  6. Anemia: Most recent Hgb stable.  Check CBC.  Disposition: F/u with Dr. Kirke Corin or an APP in 1 month.  Current medicines are reviewed at length with the patient today.  The patient did not have any concerns regarding  medicines.  Signed, Eula Listen, PA-C 06/29/2018 3:23 PM     [facility] [address] [place], [place] [address] [phone]

## 2018-06-29 ENCOUNTER — Ambulatory Visit (INDEPENDENT_AMBULATORY_CARE_PROVIDER_SITE_OTHER): Payer: Medicare Other | Admitting: Physician Assistant

## 2018-06-29 ENCOUNTER — Encounter: Payer: Self-pay | Admitting: Physician Assistant

## 2018-06-29 ENCOUNTER — Other Ambulatory Visit
Admission: RE | Admit: 2018-06-29 | Discharge: 2018-06-29 | Disposition: A | Discharge status: Home / Self Care | Payer: Medicare Other | Source: Ambulatory Visit | Attending: Emergency Medicine | Admitting: Emergency Medicine

## 2018-06-29 ENCOUNTER — Encounter: Payer: Self-pay | Admitting: Family

## 2018-06-29 ENCOUNTER — Ambulatory Visit: Payer: Medicare Other | Admitting: Family

## 2018-06-29 VITALS — BP 134/78 | HR 57 | Ht 62.0 in | Wt 134.0 lb

## 2018-06-29 DIAGNOSIS — Z7982 Long term (current) use of aspirin: Secondary | ICD-10-CM

## 2018-06-29 DIAGNOSIS — I10 Essential (primary) hypertension: Secondary | ICD-10-CM

## 2018-06-29 DIAGNOSIS — I5032 Chronic diastolic (congestive) heart failure: Secondary | ICD-10-CM

## 2018-06-29 DIAGNOSIS — I350 Nonrheumatic aortic (valve) stenosis: Secondary | ICD-10-CM

## 2018-06-29 DIAGNOSIS — Z825 Family history of asthma and other chronic lower respiratory diseases: Secondary | ICD-10-CM | POA: Insufficient documentation

## 2018-06-29 DIAGNOSIS — I110 Hypertensive heart disease with heart failure: Secondary | ICD-10-CM

## 2018-06-29 DIAGNOSIS — Z833 Family history of diabetes mellitus: Secondary | ICD-10-CM | POA: Insufficient documentation

## 2018-06-29 DIAGNOSIS — E785 Hyperlipidemia, unspecified: Secondary | ICD-10-CM

## 2018-06-29 DIAGNOSIS — R778 Other specified abnormalities of plasma proteins: Secondary | ICD-10-CM

## 2018-06-29 DIAGNOSIS — D649 Anemia, unspecified: Secondary | ICD-10-CM

## 2018-06-29 DIAGNOSIS — E782 Mixed hyperlipidemia: Secondary | ICD-10-CM

## 2018-06-29 DIAGNOSIS — R7989 Other specified abnormal findings of blood chemistry: Secondary | ICD-10-CM

## 2018-06-29 DIAGNOSIS — Z8249 Family history of ischemic heart disease and other diseases of the circulatory system: Secondary | ICD-10-CM | POA: Insufficient documentation

## 2018-06-29 DIAGNOSIS — Z880 Allergy status to penicillin: Secondary | ICD-10-CM | POA: Insufficient documentation

## 2018-06-29 DIAGNOSIS — Z79899 Other long term (current) drug therapy: Secondary | ICD-10-CM | POA: Insufficient documentation

## 2018-06-29 LAB — BASIC METABOLIC PANEL
Anion gap: 10 (ref 5–15)
BUN: 41 mg/dL — ABNORMAL HIGH (ref 8–23)
CO2: 26 mmol/L (ref 22–32)
Calcium: 9.3 mg/dL (ref 8.9–10.3)
Chloride: 103 mmol/L (ref 98–111)
Creatinine, Ser: 1.44 mg/dL — ABNORMAL HIGH (ref 0.44–1.00)
GFR calc non Af Amer: 31 mL/min — ABNORMAL LOW (ref >60)
GFR, EST AFRICAN AMERICAN: 36 mL/min — AB (ref >60)
Glucose, Bld: 125 mg/dL — ABNORMAL HIGH (ref 70–99)
Potassium: 4.6 mmol/L (ref 3.5–5.1)
Sodium: 139 mmol/L (ref 135–145)

## 2018-06-29 MED ORDER — POTASSIUM CHLORIDE CRYS ER 20 MEQ PO TBCR: 20.0000 meq | EXTENDED_RELEASE_TABLET | Freq: Every day | ORAL | 0 refills | Status: DC

## 2018-06-29 MED ORDER — AMLODIPINE BESYLATE 10 MG PO TABS: 10.0000 mg | ORAL_TABLET | Freq: Every day | ORAL | 0 refills | Status: DC

## 2018-06-29 MED ORDER — FUROSEMIDE 20 MG PO TABS: 20.0000 mg | ORAL_TABLET | Freq: Every day | ORAL | 0 refills | Status: DC

## 2018-06-29 MED ORDER — SIMVASTATIN 20 MG PO TABS: 20.0000 mg | ORAL_TABLET | Freq: Every day | ORAL | 0 refills | Status: DC

## 2018-06-29 MED ORDER — ATENOLOL 50 MG PO TABS: 50.0000 mg | ORAL_TABLET | Freq: Every day | ORAL | 0 refills | Status: DC

## 2018-06-29 NOTE — Patient Instructions (Signed)
Medication Instructions:  Your physician recommends that you continue on your current medications as directed. Please refer to the Current Medication list given to you today.  If you need a refill on your cardiac medications before your next appointment, please call your pharmacy.   Lab work: Your physician recommends that you return for lab work today at the medical mall.  If you have labs (blood work) drawn today and your tests are completely normal, you will receive your results only by: Marland Kitchen MyChart Message (if you have MyChart) OR . A paper copy in the mail If you have any lab test that is abnormal or we need to change your treatment, we will call you to review the results.  Testing/Procedures: 1- R/L Heart Cath  Your physician has requested that you have a cardiac catheterization. Cardiac catheterization is used to diagnose and/or treat various heart conditions. Doctors may recommend this procedure for a number of different reasons. The most common reason is to evaluate chest pain. Chest pain can be a symptom of coronary artery disease (CAD), and cardiac catheterization can show whether plaque is narrowing or blocking your heart's arteries. This procedure is also used to evaluate the valves, as well as measure the blood flow and oxygen levels in different parts of your heart. For further information please visit [email or web address] Please follow instruction sheet, as given.     [facility] CARDIOVASCULAR DIVISION [facility] [address] [name], [address] [place] [address] Dept: [phone] Loc: [phone]  Natalie Sherman  06/29/2018  You are scheduled for a Cardiac Catheterization on Tuesday, January 14 with Dr. Tonny Bollman.  1. Please arrive at the [facility] (Main Entrance A) at [facility]: [address] [place], [place] [address] at 7:00 AM (This time is two hours before your procedure to ensure your preparation). Free  valet parking service is available.   Special note: Every effort is made to have your procedure done on time. Please understand that emergencies sometimes delay scheduled procedures.  2. Diet: Do not eat solid foods after midnight.  The patient may have clear liquids until 5am upon the day of the procedure.  3. Labs: You will need to have blood drawn today 4. Medication instructions in preparation for your procedure:  Hold Lasix the morning of your procedure.  On the morning of your procedure, take your Aspirin and any morning medicines NOT listed above.  You may use sips of water.  5. Plan for one night stay--bring personal belongings. 6. Bring a current list of your medications and current insurance cards. 7. You MUST have a responsible person to drive you home. 8. Someone MUST be with you the first 24 hours after you arrive home or your discharge will be delayed. 9. Please wear clothes that are easy to get on and off and wear slip-on shoes.  Thank you for allowing [place] to care for you!   -- [facility]   Follow-Up: At [facility], you and your health needs are our priority.  As part of our continuing mission to provide you with exceptional heart care, we have created designated Provider Care Teams.  These Care Teams include your primary Cardiologist (physician) and Advanced Practice Providers (APPs -  Physician Assistants and Nurse Practitioners) who all work together to provide you with the care you need, when you need it. You will need a follow up appointment in 1 months.  Please call our office 2 months  in advance to schedule this appointment.  You may see Eula Listen, PA or one of the following Advanced Practice Providers on your designated Care Team:   Nicolasa Ducking, NP . Marisue Ivan, PA-C

## 2018-06-29 NOTE — Progress Notes (Signed)
Patient ID: Natalie Sherman, female    DOB: July 04, 1921, 85 y.o.   MRN: [name]  HPI  Natalie Sherman is a 85 y/o female with a history of HTN, hyperlipidemia, severe aortic stenosis and chronic HF.   Echo report from 06/17/18 reviewed and showed an EF of 60-65% along with moderate MR and severe AS.  Admitted 06/16/18 due to acute on chronic HF. Cardiology consult obtained. Initially needed IV lasix and then transitioned to oral diuretics. Elevated troponins thought to be due to demand ischemia. Discharged after 4 days.   She presents today for her initial visit with a chief complaint of minimal fatigue upon moderate exertion. She describes this as having been present for several months. She has associated cough and "short-term confusion" after taking her furosemide in the morning. She denies any difficulty sleeping, abdominal distention, palpitations, pedal edema, chest pain, shortness of breath, dizziness or weight gain.   Past Medical History:  Diagnosis Date  . Chronic diastolic CHF (congestive heart failure) (HCC)    a. EF of 60 to 65%, moderate LVH, no regional wall motion abnormalities, grade 1 diastolic dysfunction, severe aortic stenosis with a valve area less than 0.5 cm, mild aortic insufficiency, moderate mitral regurgitation  . Hyperlipidemia   . Hypertension   . Severe aortic stenosis    Past Surgical History:  Procedure Laterality Date  . DILATION AND CURETTAGE OF UTERUS  1968   Family History  Problem Relation Age of Onset  . Diabetes Mellitus II Mother   . Heart disease Father   . COPD Father    Social History   Tobacco Use  . Smoking status: Never Smoker  . Smokeless tobacco: Never Used  Substance Use Topics  . Alcohol use: Never    Frequency: Never   Allergies  Allergen Reactions  . Penicillins Rash    DID THE REACTION INVOLVE: Swelling of the face/tongue/throat, SOB, or low BP? No Sudden or severe rash/hives, skin peeling, or the inside of the mouth or  nose? No Did it require medical treatment? No When did it last happen?more than 10 years ago If all above answers are "NO", may proceed with cephalosporin use.    Prior to Admission medications   Medication Sig Start Date End Date Taking? Authorizing Provider  aspirin 81 MG EC tablet Take 1 tablet (81 mg total) by mouth daily. 06/20/18  Yes Shaune Pollackhen, Qing, MD  cholecalciferol (VITAMIN D3) 25 MCG (1000 UT) tablet Take 4,000 Units by mouth daily.    Yes [provider]  Multiple Vitamin (MULTIVITAMIN WITH MINERALS) TABS tablet Take 1 tablet by mouth daily. Centrum Silver   Yes [provider]  Omega-3 Fatty Acids (FISH OIL) 1000 MG CAPS Take 2,000 mg by mouth daily.    Yes [provider]  amLODipine (NORVASC) 10 MG tablet Take 1 tablet (10 mg total) by mouth daily. 06/29/18   Dunn, Raymon Muttonyan M, PA-C  Ascorbic Acid (VITAMIN C) 1000 MG tablet Take 2,000 mg by mouth daily.    [provider]  atenolol (TENORMIN) 50 MG tablet Take 1 tablet (50 mg total) by mouth daily. Patient taking differently: Take 50 mg by mouth every evening.  06/29/18   Dunn, Raymon Muttonyan M, PA-C  Calcium-Magnesium-Zinc (CAL-MAG-ZINC PO) Take 2 tablets by mouth daily.    [provider]  furosemide (LASIX) 20 MG tablet Take 1 tablet (20 mg total) by mouth daily. 06/29/18   Sondra Bargesunn, Ryan M, PA-C  Multiple Vitamins-Minerals (PRESERVISION AREDS 2 PO) Take 1  tablet by mouth 2 (two) times daily.    [provider]  Polyvinyl Alcohol-Povidone (REFRESH OP) Place 1 drop into both eyes 2 (two) times daily.    [provider]  potassium chloride SA (K-DUR,KLOR-CON) 20 MEQ tablet Take 1 tablet (20 mEq total) by mouth daily. 06/29/18   Dunn, Raymon Mutton, PA-C  simvastatin (ZOCOR) 20 MG tablet Take 1 tablet (20 mg total) by mouth daily. Patient taking differently: Take 20 mg by mouth every evening.  06/29/18   Sondra Barges, PA-C    Review of Systems  Constitutional: Positive for fatigue (minimal).  Negative for appetite change.  HENT: Positive for postnasal drip. Negative for congestion and sore throat.   Eyes: Negative.   Respiratory: Positive for cough (dry). Negative for shortness of breath.   Cardiovascular: Negative for chest pain, palpitations and leg swelling.  Gastrointestinal: Negative for abdominal distention and abdominal pain.  Endocrine: Negative.   Genitourinary: Negative.   Musculoskeletal: Negative for back pain and neck pain.  Skin: Negative.   Allergic/Immunologic: Negative.   Neurological: Negative for dizziness and light-headedness.  Hematological: Negative for adenopathy. Does not bruise/bleed easily.  Psychiatric/Behavioral: Negative for dysphoric mood and sleep disturbance (sleeping on 1 pillow). The patient is not nervous/anxious.     Vitals:   06/29/18 1147  BP: (!) 139/57  Pulse: 60  Resp: 18  SpO2: 97%  Weight: 134 lb 6 oz (61 kg)  Height: 5\' 2"  (1.575 m)   Wt Readings from Last 3 Encounters:  06/29/18 134 lb 6 oz (61 kg)  06/20/18 126 lb 6.4 oz (57.3 kg)  05/26/15 144 lb (65.3 kg)   Lab Results  Component Value Date   CREATININE 1.16 (H) 06/20/2018   CREATININE 1.23 (H) 06/19/2018   CREATININE 1.25 (H) 06/18/2018    Physical Exam Vitals signs and nursing note reviewed.  Constitutional:      Appearance: Normal appearance.  HENT:     Head: Normocephalic and atraumatic.  Neck:     Musculoskeletal: Normal range of motion and neck supple.  Cardiovascular:     Rate and Rhythm: Normal rate and regular rhythm.     Heart sounds: Murmur present. Systolic murmur present with a grade of 3/6.  Pulmonary:     Effort: Pulmonary effort is normal.     Breath sounds: Normal breath sounds. No wheezing or rales.  Abdominal:     Palpations: Abdomen is soft.     Tenderness: There is no abdominal tenderness.  Musculoskeletal:        General: No swelling or tenderness.  Skin:    General: Skin is warm and dry.  Neurological:     General: No focal  deficit present.     Mental Status: She is alert and oriented to person, place, and time.  Psychiatric:        Mood and Affect: Mood normal.        Behavior: Behavior normal.        Thought Content: Thought content normal.    Assessment & Plan:  1: Chronic heart failure with preserved ejection fraction- - NYHA class II - euvolemic today - weighing daily and she was reminded to call for an overnight weight gain of > 2 pounds or a weekly weight gain of >5 pounds - not adding salt to her food and is trying to follow a 2000mg  sodium diet - does drink ensure daily - has initial appointment with cardiology Shea Evans) later today - BNP 06/16/18 was 1541.0 - reports  receiving her flu vaccine for this season  2: HTN- - BP looks good today - sees PCP Clelia Croft) @ [facility] - BMP from 06/20/18 reviewed and showed sodium 138, potassium 4.3, creatinine 1.16 and GFR 40  3: Aortic stenosis- - followed by Dr. Excell Seltzer - high risk for TAVR but patient is wanting to get the surgery and says that she would "have it tomorrow if I could". Is aware that there are multiple steps to go through prior to surgery  Medication list was reviewed.  Return in 3 months or sooner for any questions/problems before then.

## 2018-06-29 NOTE — Patient Instructions (Signed)
Continue weighing daily and call for an overnight weight gain of > 2 pounds or a weekly weight gain of >5 pounds. 

## 2018-06-30 ENCOUNTER — Telehealth: Payer: Self-pay | Admitting: Physician Assistant

## 2018-06-30 ENCOUNTER — Telehealth: Payer: Self-pay | Admitting: *Deleted

## 2018-06-30 ENCOUNTER — Encounter: Payer: Self-pay | Admitting: Family

## 2018-06-30 DIAGNOSIS — I350 Nonrheumatic aortic (valve) stenosis: Secondary | ICD-10-CM | POA: Insufficient documentation

## 2018-06-30 DIAGNOSIS — I10 Essential (primary) hypertension: Secondary | ICD-10-CM | POA: Insufficient documentation

## 2018-06-30 DIAGNOSIS — N289 Disorder of kidney and ureter, unspecified: Secondary | ICD-10-CM

## 2018-06-30 DIAGNOSIS — I5033 Acute on chronic diastolic (congestive) heart failure: Secondary | ICD-10-CM | POA: Insufficient documentation

## 2018-06-30 MED ORDER — POTASSIUM CHLORIDE ER 10 MEQ PO TBCR: EXTENDED_RELEASE_TABLET | ORAL | 1 refills | Status: DC

## 2018-06-30 MED ORDER — FUROSEMIDE 20 MG PO TABS: ORAL_TABLET | ORAL | 1 refills | Status: DC

## 2018-06-30 NOTE — Telephone Encounter (Signed)
Notes recorded by Sondra Barges, PA-C on 06/30/2018 at 9:28 AM EST Renal function is trending upwards. Potassium at goal.  Please hold Lasix and KCl for the next 2 days, then start taking Lasix 20 mg every other day along with KCl 10 mEq every other day. We should recheck a BMET next week prior to her cath.

## 2018-06-30 NOTE — Addendum Note (Signed)
Addended bySherri Rad C on: 06/30/2018 03:15 PM   Modules accepted: Orders

## 2018-06-30 NOTE — Telephone Encounter (Signed)
Reached patient at home. She asked me to call her son in law, Dennielle Levenberg [phone]) to give this information.  She is aware left heart cath has been cancelled for 07/04/17 and requested that Dr. Earmon Phoenix nurse call her daughter, Suella Wilmore [phone]) to schedule the consult appointment.   Pt had just been called to review lab results/medicine changes and f/u labs by RN.  Advised patient's son in law to continue with that plan/changes/follow up labs on 07/03/18.

## 2018-06-30 NOTE — Telephone Encounter (Addendum)
Received message from Dr. Earmon Phoenix nurse that patient is scheduled for left heart cath on 07/04/18 and Dr. Excell Seltzer is not available to for this procedure that day. Tried to reach patient to inform.  Line busy. Pt needs to know that patient will be called by Dr. Earmon Phoenix nurse on Monday to be scheduled for a consult with Dr. Excell Seltzer.  Called cath lab, spoke with Eileen Stanford to cancel cath for 07/04/18. Pt needs informed.

## 2018-06-30 NOTE — Telephone Encounter (Signed)
I called and spoke with the patient regarding her lab results. She asked that I call her daughter Dois Davenport at [phone] to discuss.  I did call and speak with Dois Davenport. She is aware of the patient's results and Alycia Rossetti, PA's recommendations to:  1) hold lasix and potassium x 2 days 2) then restart lasix 20 mg every other day & potassium 10 meq every other day 3) recheck a BMP on Monday 1/13 due to her cath being on 1/14.   I advised Dois Davenport I will update the patient's RX's at the pharmacy for lasix and potassium with the new directions and dose of potassium.  The patient is scheduled to come to the office on 07/03/2018 for a repeat BMP at 11:00 am.  I called and left a message on the pharmacy voice mail at [facility] that we have sent in updated RX's for her lasix and potassium and to please disregard what was sent in yesterday.

## 2018-07-03 ENCOUNTER — Other Ambulatory Visit: Payer: Medicare Other

## 2018-07-03 NOTE — Telephone Encounter (Signed)
Noted- to Eula Listen, PA as an [place] Childes.

## 2018-07-03 NOTE — Telephone Encounter (Signed)
Scheduled patient for TAVR consult 07/21/2018. Her daughter was grateful for call and agrees with treatment plan.

## 2018-07-04 ENCOUNTER — Other Ambulatory Visit (INDEPENDENT_AMBULATORY_CARE_PROVIDER_SITE_OTHER): Payer: Medicare Other | Admitting: *Deleted

## 2018-07-04 ENCOUNTER — Encounter (HOSPITAL_COMMUNITY): Admission: RE | Payer: Self-pay | Source: Home / Self Care

## 2018-07-04 ENCOUNTER — Other Ambulatory Visit: Payer: Medicare Other

## 2018-07-04 ENCOUNTER — Ambulatory Visit (HOSPITAL_COMMUNITY): Admission: RE | Admit: 2018-07-04 | Payer: Medicare Other | Source: Home / Self Care | Admitting: Cardiovascular Disease

## 2018-07-04 DIAGNOSIS — N289 Disorder of kidney and ureter, unspecified: Secondary | ICD-10-CM

## 2018-07-04 SURGERY — RIGHT/LEFT HEART CATH AND CORONARY ANGIOGRAPHY
Anesthesia: LOCAL

## 2018-07-05 ENCOUNTER — Telehealth: Payer: Self-pay

## 2018-07-05 LAB — BASIC METABOLIC PANEL
BUN / CREAT RATIO: 23 (ref 12–28)
BUN: 26 mg/dL (ref 10–36)
CO2: 26 mmol/L (ref 20–29)
Calcium: 9.7 mg/dL (ref 8.7–10.3)
Chloride: 104 mmol/L (ref 96–106)
Creatinine, Ser: 1.12 mg/dL — ABNORMAL HIGH (ref 0.57–1.00)
GFR calc Af Amer: 48 mL/min/{1.73_m2} — ABNORMAL LOW (ref >59)
GFR calc non Af Amer: 42 mL/min/{1.73_m2} — ABNORMAL LOW (ref >59)
Glucose: 105 mg/dL — ABNORMAL HIGH (ref 65–99)
Potassium: 4.9 mmol/L (ref 3.5–5.2)
Sodium: 145 mmol/L — ABNORMAL HIGH (ref 134–144)

## 2018-07-05 NOTE — Telephone Encounter (Signed)
Call to pt about lab results, she asked that I call daughter Dois Davenport.  Call to [facility]. She verbalized understanding and had no further questions at this time.  Advised pt to call for any further questions or concerns

## 2018-07-05 NOTE — Telephone Encounter (Signed)
-----   Message from Sondra Barges, PA-C sent at 07/05/2018 10:32 AM EST ----- Recheck renal function improved. Follow up with [facility] as directed.

## 2018-07-21 ENCOUNTER — Encounter: Payer: Self-pay | Admitting: Cardiovascular Disease

## 2018-07-21 ENCOUNTER — Ambulatory Visit (INDEPENDENT_AMBULATORY_CARE_PROVIDER_SITE_OTHER): Payer: Medicare Other | Admitting: Cardiovascular Disease

## 2018-07-21 VITALS — BP 110/60 | HR 73 | Ht 62.0 in | Wt 136.4 lb

## 2018-07-21 DIAGNOSIS — I350 Nonrheumatic aortic (valve) stenosis: Secondary | ICD-10-CM | POA: Diagnosis not present

## 2018-07-21 LAB — BASIC METABOLIC PANEL
BUN/Creatinine Ratio: 25 (ref 12–28)
BUN: 28 mg/dL (ref 10–36)
CO2: 25 mmol/L (ref 20–29)
Calcium: 10.1 mg/dL (ref 8.7–10.3)
Chloride: 100 mmol/L (ref 96–106)
Creatinine, Ser: 1.12 mg/dL — ABNORMAL HIGH (ref 0.57–1.00)
GFR, EST AFRICAN AMERICAN: 48 mL/min/{1.73_m2} — AB (ref >59)
GFR, EST NON AFRICAN AMERICAN: 42 mL/min/{1.73_m2} — AB (ref >59)
Glucose: 95 mg/dL (ref 65–99)
Potassium: 5.1 mmol/L (ref 3.5–5.2)
Sodium: 141 mmol/L (ref 134–144)

## 2018-07-21 LAB — CBC WITH DIFFERENTIAL/PLATELET
Basophils Absolute: 0.1 10*3/uL (ref 0.0–0.2)
Basos: 1 %
EOS (ABSOLUTE): 0.1 10*3/uL (ref 0.0–0.4)
Eos: 2 %
Hematocrit: 34.6 % (ref 34.0–46.6)
Hemoglobin: 11.4 g/dL (ref 11.1–15.9)
Immature Grans (Abs): 0 10*3/uL (ref 0.0–0.1)
Immature Granulocytes: 0 %
Lymphocytes Absolute: 1.8 10*3/uL (ref 0.7–3.1)
Lymphs: 28 %
MCH: 30.4 pg (ref 26.6–33.0)
MCHC: 32.9 g/dL (ref 31.5–35.7)
MCV: 92 fL (ref 79–97)
Monocytes Absolute: 0.5 10*3/uL (ref 0.1–0.9)
Monocytes: 8 %
Neutrophils Absolute: 4 10*3/uL (ref 1.4–7.0)
Neutrophils: 61 %
Platelets: 223 10*3/uL (ref 150–450)
RBC: 3.75 x10E6/uL — ABNORMAL LOW (ref 3.77–5.28)
RDW: 13.3 % (ref 11.7–15.4)
WBC: 6.5 10*3/uL (ref 3.4–10.8)

## 2018-07-21 NOTE — Patient Instructions (Addendum)
Medication Instructions:  Your provider recommends that you continue on your current medications as directed. Please refer to the Current Medication list given to you today.    Labwork: TODAY: BMET, CBC  Testing/Procedures: Your physician has requested that you have a cardiac catheterization. Cardiac catheterization is used to diagnose and/or treat various heart conditions. Doctors may recommend this procedure for a number of different reasons. The most common reason is to evaluate chest pain. Chest pain can be a symptom of coronary artery disease (CAD), and cardiac catheterization can show whether plaque is narrowing or blocking your heart's arteries. This procedure is also used to evaluate the valves, as well as measure the blood flow and oxygen levels in different parts of your heart. For further information please visit [email or web address] Please follow instruction sheet, as given.  Follow-Up: Your appointment with Eula Listen, PA on February 11 has been cancelled.  The TAVR team will call you to arrange further follow-up.  CARDIAC CATHETERIZATION INSTRUCTIONS:  You are scheduled for a Cardiac Catheterization on Tuesday, February 4 with Dr. Tonny Bollman.  1. Please arrive at the [facility] (Main Entrance A) at [facility]: [address] [place], [place] [address] at 6:30 AM (This time is two hours before your procedure to ensure your preparation). Free valet parking service is available.   Special note: Every effort is made to have your procedure done on time. Please understand that emergencies sometimes delay scheduled procedures.  2. Diet: Do not eat solid foods after midnight.  The patient may have clear liquids until 5am upon the day of the procedure.  3. Labs: TODAY.  4. Medication instructions in preparation for your procedure:  1) HOLD LASIX the morning of your catheterization  2) MAKE SURE TO TAKE YOUR ASPIRIN the morning of your procedure  3) You may take other  medications as directed with sips of water.  5. Plan for one night stay--bring personal belongings. 6. Bring a current list of your medications and current insurance cards. 7. You MUST have a responsible person to drive you home. 8. Someone MUST be with you the first 24 hours after you arrive home or your discharge will be delayed. 9. Please wear clothes that are easy to get on and off and wear slip-on shoes.  Thank you for allowing [place] to care for you!   -- [facility]

## 2018-07-21 NOTE — H&P (View-Only) (Signed)
Cardiology Office Note:    Date:  07/21/2018   ID:  Natalie Sherman, DOB 12/04/1921, MRN [id number]  PCP:  Shaw, Kimberlee, MD  Cardiologist:  No primary care provider on file.  Electrophysiologist:  None   Referring MD: Shaw, Kimberlee, MD   Chief Complaint  Patient presents with  . Shortness of Breath   History of Present Illness:    Natalie Sherman is a 85 y.o. female referred by Ryan Dunn, PA, and Dr Gollan, for evaluation of severe aortic stenosis.  The patient is here with her family members today.  She was recently hospitalized at [facility] with acute exacerbation of congestive heart failure.  She has been remarkably independent and continues to drive an automobile and live alone.  During her hospitalization an echocardiogram demonstrated findings consistent with critical aortic stenosis.  She was treated with IV diuretics and titration of medical therapy with improvement in her symptoms and ability to discharge home.  The patient has had a heart murmur since she was a young child. She was out of school for 2 months when she was 85 years old. She was told she had rheumatic fever and would likely have heart valve problems when she was older. She has been widowed for over 30 years and has lived alone since then. She lives in [place] and has daughters who live in [place] and [place]. She also has a niece who is a retired geriatric nurse. She comes by to check on her every day. She never had an echo until she was hospitalized. She reports minimal symptoms until her hospitalization in December. She woke up in the morning and felt like she couldn't catch her breath. She called her family and they brought her to the emergency room at [facility] where she was treated for heart failure. The patient is back to feeling 'fine.' She is back to doing exercises in her chair. She was riding a stationary bike before her hospitalization but hasn't been back to that level of activity yet.  She still drives a car but hasn't driven over fear of having a problem with her aortic stenosis. She was mowing her lawn with a riding mower as recently as last year. She denies chest pain or pressure. She denies lightheadedness or syncope. She is walking in her home for exercise without symptoms.   Past Medical History:  Diagnosis Date  . Chronic diastolic CHF (congestive heart failure) (HCC)    a. EF of 60 to 65%, moderate LVH, no regional wall motion abnormalities, grade 1 diastolic dysfunction, severe aortic stenosis with a valve area less than 0.5 cm, mild aortic insufficiency, moderate mitral regurgitation  . Hyperlipidemia   . Hypertension   . Severe aortic stenosis     Past Surgical History:  Procedure Laterality Date  . DILATION AND CURETTAGE OF UTERUS  1968    Current Medications: Current Meds  Medication Sig  . Acetaminophen (TYLENOL PO) Take by mouth as needed.  . amLODipine (NORVASC) 10 MG tablet Take 1 tablet (10 mg total) by mouth daily.  . Ascorbic Acid (VITAMIN C) 1000 MG tablet Take 2,000 mg by mouth daily.  . aspirin 81 MG EC tablet Take 1 tablet (81 mg total) by mouth daily.  . atenolol (TENORMIN) 50 MG tablet Take 1 tablet (50 mg total) by mouth daily.  . Calcium-Magnesium-Zinc (CAL-MAG-ZINC PO) Take 2 tablets by mouth daily.  . cholecalciferol (VITAMIN D3) 25 MCG (1000 UT) tablet Take 4,000 Units by mouth daily.   .   furosemide (LASIX) 20 MG tablet Take 1 tablet (20 mg) by mouth once every other day  . Multiple Vitamin (MULTIVITAMIN WITH MINERALS) TABS tablet Take 1 tablet by mouth daily. Centrum Silver  . Multiple Vitamins-Minerals (PRESERVISION AREDS 2 PO) Take 1 tablet by mouth 2 (two) times daily.  . Omega-3 Fatty Acids (FISH OIL) 1000 MG CAPS Take 2,000 mg by mouth daily.   . Polyvinyl Alcohol-Povidone (REFRESH OP) Place 1 drop into both eyes 2 (two) times daily.  . potassium chloride (K-DUR) 10 MEQ tablet Take 1 tablet (10 meq) by mouth every other day  .  simvastatin (ZOCOR) 20 MG tablet Take 1 tablet (20 mg total) by mouth daily.     Allergies:   Penicillins   Social History   Socioeconomic History  . Marital status: Widowed    Spouse name: Not on file  . Number of children: Not on file  . Years of education: Not on file  . Highest education level: Not on file  Occupational History  . Occupation: retired  Social Needs  . Financial resource strain: Not on file  . Food insecurity:    Worry: Not on file    Inability: Not on file  . Transportation needs:    Medical: Not on file    Non-medical: Not on file  Tobacco Use  . Smoking status: Never Smoker  . Smokeless tobacco: Never Used  Substance and Sexual Activity  . Alcohol use: Never    Frequency: Never  . Drug use: Never  . Sexual activity: Not Currently  Lifestyle  . Physical activity:    Days per week: Not on file    Minutes per session: Not on file  . Stress: Not on file  Relationships  . Social connections:    Talks on phone: Not on file    Gets together: Not on file    Attends religious service: Not on file    Active member of club or organization: Not on file    Attends meetings of clubs or organizations: Not on file    Relationship status: Not on file  Other Topics Concern  . Not on file  Social History Narrative  . Not on file     Family History: The patient's family history includes COPD in her father; Diabetes Mellitus II in her mother; Heart disease in her father.  ROS:   Please see the history of present illness.    Positive for back pain. All other systems reviewed and are negative.  EKGs/Labs/Other Studies Reviewed:    The following studies were reviewed today: Echo: Left ventricle:  The cavity size was normal. Wall thickness was increased in a pattern of moderate LVH. There was moderate concentric hypertrophy. Systolic function was normal. The estimated ejection fraction was in the range of 60% to 65%. Wall motion was normal; there were no  regional wall motion abnormalities. Doppler parameters are consistent with abnormal left ventricular relaxation (grade 1 diastolic dysfunction).  ------------------------------------------------------------------- Aortic valve:  Mod /Severe Ca# Severe/Critical AS Valve area<0.5cm2.  Trileaflet; mildly thickened, mildly calcified leaflets. Mobility was not restricted.  Doppler:  Transvalvular velocity was within the normal range. There was no stenosis. There was mild regurgitation.    VTI ratio of LVOT to aortic valve: 0.21. Valve area (VTI): 0.52 cm^2. Indexed valve area (VTI): 0.31 cm^2/m^2. Peak velocity ratio of LVOT to aortic valve: 0.16. Valve area (Vmax): 0.42 cm^2. Indexed valve area (Vmax): 0.25 cm^2/m^2. Mean velocity ratio of LVOT to aortic valve: 0.21. Valve   area (Vmean): 0.52 cm^2. Indexed valve area (Vmean): 0.32 cm^2/m^2. Mean gradient (S): 40 mm Hg. Peak gradient (S): 98 mm Hg.  ------------------------------------------------------------------- Aorta:  The aorta was normal, not dilated, and non-diseased. Aortic root: The aortic root was normal in size.  ------------------------------------------------------------------- Mitral valve:   Mildly thickened leaflets . Mobility was not restricted.  Doppler:  Transvalvular velocity was within the normal range. There was no evidence for stenosis. There was moderate regurgitation.    Valve area by pressure half-time: 4.58 cm^2. Indexed valve area by pressure half-time: 2.77 cm^2/m^2.    Peak gradient (D): 6 mm Hg.  ------------------------------------------------------------------- Left atrium:  The atrium was normal in size.  ------------------------------------------------------------------- Atrial septum:  Poorly visualized.  ------------------------------------------------------------------- Right ventricle:  The cavity size was normal. Wall thickness was normal. Systolic function was normal. Systolic pressure  was within the normal range.  ------------------------------------------------------------------- Pulmonic valve:    Structurally normal valve.   Cusp separation was normal.  Doppler:  Transvalvular velocity was within the normal range. There was no evidence for stenosis. There was no regurgitation.  ------------------------------------------------------------------- Tricuspid valve:   Mildly thickened leaflets.  Doppler: Transvalvular velocity was within the normal range. There was trivial regurgitation.  ------------------------------------------------------------------- Pulmonary artery:   Poorly visualized. The main pulmonary artery was normal-sized. Systolic pressure was within the normal range.   ------------------------------------------------------------------- Right atrium:  The atrium was normal in size.  ------------------------------------------------------------------- Pericardium:  The pericardium was normal in appearance. There was no pericardial effusion.  ------------------------------------------------------------------- Systemic veins: Inferior vena cava: The vessel was normal in size.  ------------------------------------------------------------------- Post procedure conclusions Ascending Aorta:  - The aorta was normal, not dilated, and non-diseased.  ------------------------------------------------------------------- Measurements   Left ventricle                           Value          Reference  LV ID, ED, PLAX chordal                  48.2  mm       43 - 52  LV ID, ES, PLAX chordal                  34.6  mm       23 - 38  LV fx shortening, PLAX chordal   (L)     28    %        >=29  LV PW thickness, ED                      11.7  mm       ----------  IVS/LV PW ratio, ED                      1.07           <=1.3  Stroke volume, 2D                        61    ml       ----------  Stroke volume/bsa, 2D                    37    ml/m^2    ----------    Ventricular septum                       Value            Reference  IVS thickness, ED                        12.5  mm       ----------    LVOT                                     Value          Reference  LVOT ID, S                               18    mm       ----------  LVOT area                                2.54  cm^2     ----------  LVOT peak velocity, S                    81.2  cm/s     ----------  LVOT mean velocity, S                    57.7  cm/s     ----------  LVOT VTI, S                              24    cm       ----------    Aortic valve                             Value          Reference  Aortic valve peak velocity, S            496   cm/s     ----------  Aortic valve mean velocity, S            280   cm/s     ----------  Aortic valve VTI, S                      117   cm       ----------  Aortic mean gradient, S                  40    mm Hg    ----------  Aortic peak gradient, S                  98    mm Hg    ----------  VTI ratio, LVOT/AV                       0.21           ----------  Aortic valve area, VTI                   0.52  cm^2     ----------  Aortic valve area/bsa, VTI               0.31  cm^2/m^2 ----------  Velocity ratio, peak, LVOT/AV            0.16           ----------    Aortic valve area, peak velocity         0.42  cm^2     ----------  Aortic valve area/bsa, peak              0.25  cm^2/m^2 ----------  velocity  Velocity ratio, mean, LVOT/AV            0.21           ----------  Aortic valve area, mean velocity         0.52  cm^2     ----------  Aortic valve area/bsa, mean              0.32  cm^2/m^2 ----------  velocity  Aortic regurg pressure half-time         481   ms       ----------    Aorta                                    Value          Reference  Aortic root ID, ED                       36    mm       ----------    Left atrium                              Value          Reference  LA ID, A-P, ES                            49    mm       ----------  LA ID/bsa, A-P                   (H)     2.97  cm/m^2   <=2.2  LA volume, S                             94.5  ml       ----------  LA volume/bsa, S                         57.2  ml/m^2   ----------  LA volume, ES, 1-p A4C                   78.2  ml       ----------  LA volume/bsa, ES, 1-p A4C               47.3  ml/m^2   ----------  LA volume, ES, 1-p A2C                   114   ml       ----------  LA volume/bsa, ES, 1-p A2C               69    ml/m^2   ----------    Mitral valve                             Value          Reference  Mitral E-wave   peak velocity              127   cm/s     ----------  Mitral A-wave peak velocity              135   cm/s     ----------  Mitral deceleration time                 165   ms       150 - 230  Mitral pressure half-time                48    ms       ----------  Mitral peak gradient, D                  6     mm Hg    ----------  Mitral E/A ratio, peak                   0.9            ----------  Mitral valve area, PHT, DP               4.58  cm^2     ----------  Mitral valve area/bsa, PHT, DP           2.77  cm^2/m^2 ----------  Mitral regurg VTI, PISA                  268   cm       ----------    Right atrium                             Value          Reference  RA ID, S-I, ES, A4C                      49    mm       34 - 49  RA area, ES, A4C                         12    cm^2     8.3 - 19.5  RA volume, ES, A/L                       25.9  ml       ----------  RA volume/bsa, ES, A/L                   15.7  ml/m^2   ----------    Systemic veins                           Value          Reference  Estimated CVP                            3     mm Hg    ----------    Right ventricle                          Value          Reference  RV ID, ED, PLAX                  (  H)     39.7  mm       19 - 38  TAPSE                                    18.2  mm       ----------  RV s&', lateral, S                        10.1  cm/s      ----------    Pulmonic valve                           Value          Reference  Pulmonic regurg velocity, ED             103   cm/s     ----------  CXR 06/16/2018: FINDINGS: Cardiac silhouette mildly enlarged. Thoracic aorta atherosclerotic. Hilar and mediastinal contours otherwise unremarkable. Diffuse interstitial and airspace opacities throughout both lungs. No confluent airspace consolidation. Small pleural effusions suspected.  Osseous demineralization. Remote healed fracture involving the RIGHT clavicular head.  IMPRESSION: Mild CHF, with diffuse interstitial and airspace pulmonary edema and likely small bilateral pleural effusions.  EKG:  EKG is not ordered today.  The ekg from June 29, 2018 is reviewed and demonstrates normal sinus rhythm with LVH.  There is no conduction delay present.  Recent Labs: 06/16/2018: ALT 21; B Natriuretic Peptide 1,541.0 06/17/2018: Platelets 262 06/19/2018: Magnesium 2.2 06/20/2018: Hemoglobin 10.7 07/04/2018: BUN 26; Creatinine, Ser 1.12; Potassium 4.9; Sodium 145  Recent Lipid Panel    Component Value Date/Time   CHOL 120 06/17/2018 0400   TRIG 117 06/17/2018 0400   HDL 25 (L) 06/17/2018 0400   CHOLHDL 4.8 06/17/2018 0400   VLDL 23 06/17/2018 0400   LDLCALC 72 06/17/2018 0400    Physical Exam:    VS:  BP 110/60   Pulse 73   Ht 5' 2" (1.575 m)   Wt 136 lb 6.4 oz (61.9 kg)   SpO2 99%   BMI 24.95 kg/m     Wt Readings from Last 3 Encounters:  07/21/18 136 lb 6.4 oz (61.9 kg)  06/29/18 134 lb (60.8 kg)  06/29/18 134 lb 6 oz (61 kg)     GEN:  Well nourished, well developed, elderly woman in no acute distress HEENT: Normal NECK: No JVD; BL carotid bruits, delayed carotid upstrokes LYMPHATICS: No lymphadenopathy CARDIAC: RRR, 4/6 harsh late peaking systolic murmur at the right upper sternal border with absent A2 RESPIRATORY:  Clear to auscultation without rales, wheezing or rhonchi  ABDOMEN: Soft, non-tender,  non-distended MUSCULOSKELETAL:  No edema; No deformity  SKIN: Warm and dry NEUROLOGIC:  Alert and oriented x 3 PSYCHIATRIC:  Normal affect   ASSESSMENT:    1. Severe aortic stenosis    PLAN:    In order of problems listed above:  1. The patient is a remarkably spry elderly woman with critical stage D1 aortic stenosis and recent hospitalization for acute diastolic heart failure with pulmonary edema. I have reviewed the natural history of aortic stenosis with the patient and their family members who are present today. We have discussed the limitations of medical therapy and the poor prognosis associated with symptomatic aortic stenosis. We have reviewed potential treatment options, including palliative medical therapy, conventional surgical aortic valve replacement, and transcatheter aortic   valve replacement. We discussed treatment options in the context of the patient's specific comorbid medical conditions.  I have personally reviewed the patient's echo images today.  Her aortic valve is severely calcified and restricted.  LV function is preserved.  RV function appears normal.  There is moderate mitral regurgitation which I suspect is functional and related to her severe aortic stenosis and elevated LV pressure.  The patient's peak and mean transvalvular gradients are 103 and 66 mmHg respectively.  She clearly has critical aortic stenosis and has minimal symptoms with the exception of her recent heart failure exacerbation.  Aortic valve replacement is indicated and TAVR is likely her only option considering her advanced age of 85 years old.  We reviewed next steps in testing.  The patient is very eager to move forward with her testing and treatment as she wants to get back to driving and all of her regular activities without restrictions.  She will undergo right and left heart catheterization as well as CTA studies of the heart and the chest, abdomen, and pelvis.  She understands that cardiac surgical  evaluation will follow these tests as part of a multidisciplinary approach to her care.  We will move forward with her testing as quickly as possible in this context of critical aortic stenosis.  I have reviewed the risks, indications, and alternatives to cardiac catheterization, possible angioplasty, and stenting with the patient. Risks include but are not limited to bleeding, infection, vascular injury, stroke, myocardial infection, arrhythmia, kidney injury, radiation-related injury in the case of prolonged fluoroscopy use, emergency cardiac surgery, and death. The patient understands the risks of serious complication is 1-2 in 1000 with diagnostic cardiac cath and 1-2% or less with angioplasty/stenting.   Medication Adjustments/Labs and Tests Ordered: Current medicines are reviewed at length with the patient today.  Concerns regarding medicines are outlined above.  Orders Placed This Encounter  Procedures  . CT CORONARY MORPH W/CTA COR W/SCORE W/CA W/CM &/OR WO/CM  . CT ANGIO ABDOMEN PELVIS  W &/OR WO CONTRAST  . CT ANGIO CHEST AORTA W &/OR WO CONTRAST  . Basic metabolic panel  . CBC with Differential/Platelet  . Pulmonary Function Test   No orders of the defined types were placed in this encounter.   Patient Instructions  Medication Instructions:  Your provider recommends that you continue on your current medications as directed. Please refer to the Current Medication list given to you today.    Labwork: TODAY: BMET, CBC  Testing/Procedures: Your physician has requested that you have a cardiac catheterization. Cardiac catheterization is used to diagnose and/or treat various heart conditions. Doctors may recommend this procedure for a number of different reasons. The most common reason is to evaluate chest pain. Chest pain can be a symptom of coronary artery disease (CAD), and cardiac catheterization can show whether plaque is narrowing or blocking your heart's arteries. This procedure  is also used to evaluate the valves, as well as measure the blood flow and oxygen levels in different parts of your heart. For further information please visit www.cardiosmart.org. Please follow instruction sheet, as given.  Follow-Up: Your appointment with Ryan Dunn, PA on February 11 has been cancelled.  The TAVR team will call you to arrange further follow-up.  CARDIAC CATHETERIZATION INSTRUCTIONS:  You are scheduled for a Cardiac Catheterization on Tuesday, February 4 with Dr. Becky Colan.  1. Please arrive at the [facility] (Main Entrance A) at [facility]: [address] [place], [place] [address] at 6:30 AM (  This time is two hours before your procedure to ensure your preparation). Free valet parking service is available.   Special note: Every effort is made to have your procedure done on time. Please understand that emergencies sometimes delay scheduled procedures.  2. Diet: Do not eat solid foods after midnight.  The patient may have clear liquids until 5am upon the day of the procedure.  3. Labs: TODAY.  4. Medication instructions in preparation for your procedure:  1) HOLD LASIX the morning of your catheterization  2) MAKE SURE TO TAKE YOUR ASPIRIN the morning of your procedure  3) You may take other medications as directed with sips of water.  5. Plan for one night stay--bring personal belongings. 6. Bring a current list of your medications and current insurance cards. 7. You MUST have a responsible person to drive you home. 8. Someone MUST be with you the first 24 hours after you arrive home or your discharge will be delayed. 9. Please wear clothes that are easy to get on and off and wear slip-on shoes.  Thank you for allowing us to care for you!   -- [facility]     Signed, Savvas Roper, MD  07/21/2018 1:37 PM    [facility] 

## 2018-07-21 NOTE — Progress Notes (Signed)
Cardiology Office Note:    Date:  07/21/2018   ID:  JONQUIL PIERE, DOB Aug 18, 1921, MRN [id number]  PCP:  Lupita Raider, MD  Cardiologist:  No primary care provider on file.  Electrophysiologist:  None   Referring MD: Lupita Raider, MD   Chief Complaint  Patient presents with  . Shortness of Breath   History of Present Illness:    Natalie Sherman is a 85 y.o. female referred by Eula Listen, PA, and Dr Mariah Milling, for evaluation of severe aortic stenosis.  The patient is here with her family members today.  She was recently hospitalized at [facility] with acute exacerbation of congestive heart failure.  She has been remarkably independent and continues to drive an automobile and live alone.  During her hospitalization an echocardiogram demonstrated findings consistent with critical aortic stenosis.  She was treated with IV diuretics and titration of medical therapy with improvement in her symptoms and ability to discharge home.  The patient has had a heart murmur since she was a young child. She was out of school for 2 months when she was 85 years old. She was told she had rheumatic fever and would likely have heart valve problems when she was older. She has been widowed for over 30 years and has lived alone since then. She lives in [place] and has daughters who live in [place] and [place]. She also has a niece who is a retired Estate agent. She comes by to check on her every day. She never had an echo until she was hospitalized. She reports minimal symptoms until her hospitalization in December. She woke up in the morning and felt like she couldn't catch her breath. She called her family and they brought her to the emergency room at [facility] where she was treated for heart failure. The patient is back to feeling 'fine.' She is back to doing exercises in her chair. She was riding a stationary bike before her hospitalization but hasn't been back to that level of activity yet.  She still drives a car but hasn't driven over fear of having a problem with her aortic stenosis. She was mowing her lawn with a riding mower as recently as last year. She denies chest pain or pressure. She denies lightheadedness or syncope. She is walking in her home for exercise without symptoms.   Past Medical History:  Diagnosis Date  . Chronic diastolic CHF (congestive heart failure) (HCC)    a. EF of 60 to 65%, moderate LVH, no regional wall motion abnormalities, grade 1 diastolic dysfunction, severe aortic stenosis with a valve area less than 0.5 cm, mild aortic insufficiency, moderate mitral regurgitation  . Hyperlipidemia   . Hypertension   . Severe aortic stenosis     Past Surgical History:  Procedure Laterality Date  . DILATION AND CURETTAGE OF UTERUS  1968    Current Medications: Current Meds  Medication Sig  . Acetaminophen (TYLENOL PO) Take by mouth as needed.  Marland Kitchen amLODipine (NORVASC) 10 MG tablet Take 1 tablet (10 mg total) by mouth daily.  . Ascorbic Acid (VITAMIN C) 1000 MG tablet Take 2,000 mg by mouth daily.  Marland Kitchen aspirin 81 MG EC tablet Take 1 tablet (81 mg total) by mouth daily.  Marland Kitchen atenolol (TENORMIN) 50 MG tablet Take 1 tablet (50 mg total) by mouth daily.  . Calcium-Magnesium-Zinc (CAL-MAG-ZINC PO) Take 2 tablets by mouth daily.  . cholecalciferol (VITAMIN D3) 25 MCG (1000 UT) tablet Take 4,000 Units by mouth daily.   Marland Kitchen  furosemide (LASIX) 20 MG tablet Take 1 tablet (20 mg) by mouth once every other day  . Multiple Vitamin (MULTIVITAMIN WITH MINERALS) TABS tablet Take 1 tablet by mouth daily. Centrum Silver  . Multiple Vitamins-Minerals (PRESERVISION AREDS 2 PO) Take 1 tablet by mouth 2 (two) times daily.  . Omega-3 Fatty Acids (FISH OIL) 1000 MG CAPS Take 2,000 mg by mouth daily.   . Polyvinyl Alcohol-Povidone (REFRESH OP) Place 1 drop into both eyes 2 (two) times daily.  . potassium chloride (K-DUR) 10 MEQ tablet Take 1 tablet (10 meq) by mouth every other day  .  simvastatin (ZOCOR) 20 MG tablet Take 1 tablet (20 mg total) by mouth daily.     Allergies:   Penicillins   Social History   Socioeconomic History  . Marital status: Widowed    Spouse name: Not on file  . Number of children: Not on file  . Years of education: Not on file  . Highest education level: Not on file  Occupational History  . Occupation: retired  Engineer, production  . Financial resource strain: Not on file  . Food insecurity:    Worry: Not on file    Inability: Not on file  . Transportation needs:    Medical: Not on file    Non-medical: Not on file  Tobacco Use  . Smoking status: Never Smoker  . Smokeless tobacco: Never Used  Substance and Sexual Activity  . Alcohol use: Never    Frequency: Never  . Drug use: Never  . Sexual activity: Not Currently  Lifestyle  . Physical activity:    Days per week: Not on file    Minutes per session: Not on file  . Stress: Not on file  Relationships  . Social connections:    Talks on phone: Not on file    Gets together: Not on file    Attends religious service: Not on file    Active member of club or organization: Not on file    Attends meetings of clubs or organizations: Not on file    Relationship status: Not on file  Other Topics Concern  . Not on file  Social History Narrative  . Not on file     Family History: The patient's family history includes COPD in her father; Diabetes Mellitus II in her mother; Heart disease in her father.  ROS:   Please see the history of present illness.    Positive for back pain. All other systems reviewed and are negative.  EKGs/Labs/Other Studies Reviewed:    The following studies were reviewed today: Echo: Left ventricle:  The cavity size was normal. Wall thickness was increased in a pattern of moderate LVH. There was moderate concentric hypertrophy. Systolic function was normal. The estimated ejection fraction was in the range of 60% to 65%. Wall motion was normal; there were no  regional wall motion abnormalities. Doppler parameters are consistent with abnormal left ventricular relaxation (grade 1 diastolic dysfunction).  ------------------------------------------------------------------- Aortic valve:  Mod /Severe Ca# Severe/Critical AS Valve area<0.5cm2.  Trileaflet; mildly thickened, mildly calcified leaflets. Mobility was not restricted.  Doppler:  Transvalvular velocity was within the normal range. There was no stenosis. There was mild regurgitation.    VTI ratio of LVOT to aortic valve: 0.21. Valve area (VTI): 0.52 cm^2. Indexed valve area (VTI): 0.31 cm^2/m^2. Peak velocity ratio of LVOT to aortic valve: 0.16. Valve area (Vmax): 0.42 cm^2. Indexed valve area (Vmax): 0.25 cm^2/m^2. Mean velocity ratio of LVOT to aortic valve: 0.21. Valve  area (Vmean): 0.52 cm^2. Indexed valve area (Vmean): 0.32 cm^2/m^2. Mean gradient (S): 40 mm Hg. Peak gradient (S): 98 mm Hg.  ------------------------------------------------------------------- Aorta:  The aorta was normal, not dilated, and non-diseased. Aortic root: The aortic root was normal in size.  ------------------------------------------------------------------- Mitral valve:   Mildly thickened leaflets . Mobility was not restricted.  Doppler:  Transvalvular velocity was within the normal range. There was no evidence for stenosis. There was moderate regurgitation.    Valve area by pressure half-time: 4.58 cm^2. Indexed valve area by pressure half-time: 2.77 cm^2/m^2.    Peak gradient (D): 6 mm Hg.  ------------------------------------------------------------------- Left atrium:  The atrium was normal in size.  ------------------------------------------------------------------- Atrial septum:  Poorly visualized.  ------------------------------------------------------------------- Right ventricle:  The cavity size was normal. Wall thickness was normal. Systolic function was normal. Systolic pressure  was within the normal range.  ------------------------------------------------------------------- Pulmonic valve:    Structurally normal valve.   Cusp separation was normal.  Doppler:  Transvalvular velocity was within the normal range. There was no evidence for stenosis. There was no regurgitation.  ------------------------------------------------------------------- Tricuspid valve:   Mildly thickened leaflets.  Doppler: Transvalvular velocity was within the normal range. There was trivial regurgitation.  ------------------------------------------------------------------- Pulmonary artery:   Poorly visualized. The main pulmonary artery was normal-sized. Systolic pressure was within the normal range.   ------------------------------------------------------------------- Right atrium:  The atrium was normal in size.  ------------------------------------------------------------------- Pericardium:  The pericardium was normal in appearance. There was no pericardial effusion.  ------------------------------------------------------------------- Systemic veins: Inferior vena cava: The vessel was normal in size.  ------------------------------------------------------------------- Post procedure conclusions Ascending Aorta:  - The aorta was normal, not dilated, and non-diseased.  ------------------------------------------------------------------- Measurements   Left ventricle                           Value          Reference  LV ID, ED, PLAX chordal                  48.2  mm       43 - 52  LV ID, ES, PLAX chordal                  34.6  mm       23 - 38  LV fx shortening, PLAX chordal   (L)     28    %        >=29  LV PW thickness, ED                      11.7  mm       ----------  IVS/LV PW ratio, ED                      1.07           <=1.3  Stroke volume, 2D                        61    ml       ----------  Stroke volume/bsa, 2D                    37    ml/m^2    ----------    Ventricular septum                       Value  Reference  IVS thickness, ED                        12.5  mm       ----------    LVOT                                     Value          Reference  LVOT ID, S                               18    mm       ----------  LVOT area                                2.54  cm^2     ----------  LVOT peak velocity, S                    81.2  cm/s     ----------  LVOT mean velocity, S                    57.7  cm/s     ----------  LVOT VTI, S                              24    cm       ----------    Aortic valve                             Value          Reference  Aortic valve peak velocity, S            496   cm/s     ----------  Aortic valve mean velocity, S            280   cm/s     ----------  Aortic valve VTI, S                      117   cm       ----------  Aortic mean gradient, S                  40    mm Hg    ----------  Aortic peak gradient, S                  98    mm Hg    ----------  VTI ratio, LVOT/AV                       0.21           ----------  Aortic valve area, VTI                   0.52  cm^2     ----------  Aortic valve area/bsa, VTI               0.31  cm^2/m^2 ----------  Velocity ratio, peak, LVOT/AV            0.16           ----------  Aortic valve area, peak velocity         0.42  cm^2     ----------  Aortic valve area/bsa, peak              0.25  cm^2/m^2 ----------  velocity  Velocity ratio, mean, LVOT/AV            0.21           ----------  Aortic valve area, mean velocity         0.52  cm^2     ----------  Aortic valve area/bsa, mean              0.32  cm^2/m^2 ----------  velocity  Aortic regurg pressure half-time         481   ms       ----------    Aorta                                    Value          Reference  Aortic root ID, ED                       36    mm       ----------    Left atrium                              Value          Reference  LA ID, A-P, ES                            49    mm       ----------  LA ID/bsa, A-P                   (H)     2.97  cm/m^2   <=2.2  LA volume, S                             94.5  ml       ----------  LA volume/bsa, S                         57.2  ml/m^2   ----------  LA volume, ES, 1-p A4C                   78.2  ml       ----------  LA volume/bsa, ES, 1-p A4C               47.3  ml/m^2   ----------  LA volume, ES, 1-p A2C                   114   ml       ----------  LA volume/bsa, ES, 1-p A2C               69    ml/m^2   ----------    Mitral valve                             Value          Reference  Mitral E-wave  peak velocity              127   cm/s     ----------  Mitral A-wave peak velocity              135   cm/s     ----------  Mitral deceleration time                 165   ms       150 - 230  Mitral pressure half-time                48    ms       ----------  Mitral peak gradient, D                  6     mm Hg    ----------  Mitral E/A ratio, peak                   0.9            ----------  Mitral valve area, PHT, DP               4.58  cm^2     ----------  Mitral valve area/bsa, PHT, DP           2.77  cm^2/m^2 ----------  Mitral regurg VTI, PISA                  268   cm       ----------    Right atrium                             Value          Reference  RA ID, S-I, ES, A4C                      49    mm       34 - 49  RA area, ES, A4C                         12    cm^2     8.3 - 19.5  RA volume, ES, A/L                       25.9  ml       ----------  RA volume/bsa, ES, A/L                   15.7  ml/m^2   ----------    Systemic veins                           Value          Reference  Estimated CVP                            3     mm Hg    ----------    Right ventricle                          Value          Reference  RV ID, ED, PLAX                  (  H)     39.7  mm       19 - 38  TAPSE                                    18.2  mm       ----------  RV s&', lateral, S                        10.1  cm/s      ----------    Pulmonic valve                           Value          Reference  Pulmonic regurg velocity, ED             103   cm/s     ----------  CXR 06/16/2018: FINDINGS: Cardiac silhouette mildly enlarged. Thoracic aorta atherosclerotic. Hilar and mediastinal contours otherwise unremarkable. Diffuse interstitial and airspace opacities throughout both lungs. No confluent airspace consolidation. Small pleural effusions suspected.  Osseous demineralization. Remote healed fracture involving the RIGHT clavicular head.  IMPRESSION: Mild CHF, with diffuse interstitial and airspace pulmonary edema and likely small bilateral pleural effusions.  EKG:  EKG is not ordered today.  The ekg from June 29, 2018 is reviewed and demonstrates normal sinus rhythm with LVH.  There is no conduction delay present.  Recent Labs: 06/16/2018: ALT 21; B Natriuretic Peptide 1,541.0 06/17/2018: Platelets 262 06/19/2018: Magnesium 2.2 06/20/2018: Hemoglobin 10.7 07/04/2018: BUN 26; Creatinine, Ser 1.12; Potassium 4.9; Sodium 145  Recent Lipid Panel    Component Value Date/Time   CHOL 120 06/17/2018 0400   TRIG 117 06/17/2018 0400   HDL 25 (L) 06/17/2018 0400   CHOLHDL 4.8 06/17/2018 0400   VLDL 23 06/17/2018 0400   LDLCALC 72 06/17/2018 0400    Physical Exam:    VS:  BP 110/60   Pulse 73   Ht 5\' 2"  (1.575 m)   Wt 136 lb 6.4 oz (61.9 kg)   SpO2 99%   BMI 24.95 kg/m     Wt Readings from Last 3 Encounters:  07/21/18 136 lb 6.4 oz (61.9 kg)  06/29/18 134 lb (60.8 kg)  06/29/18 134 lb 6 oz (61 kg)     GEN:  Well nourished, well developed, elderly woman in no acute distress HEENT: Normal NECK: No JVD; BL carotid bruits, delayed carotid upstrokes LYMPHATICS: No lymphadenopathy CARDIAC: RRR, 4/6 harsh late peaking systolic murmur at the right upper sternal border with absent A2 RESPIRATORY:  Clear to auscultation without rales, wheezing or rhonchi  ABDOMEN: Soft, non-tender,  non-distended MUSCULOSKELETAL:  No edema; No deformity  SKIN: Warm and dry NEUROLOGIC:  Alert and oriented x 3 PSYCHIATRIC:  Normal affect   ASSESSMENT:    1. Severe aortic stenosis    PLAN:    In order of problems listed above:  1. The patient is a remarkably spry elderly woman with critical stage D1 aortic stenosis and recent hospitalization for acute diastolic heart failure with pulmonary edema. I have reviewed the natural history of aortic stenosis with the patient and their family members who are present today. We have discussed the limitations of medical therapy and the poor prognosis associated with symptomatic aortic stenosis. We have reviewed potential treatment options, including palliative medical therapy, conventional surgical aortic valve replacement, and transcatheter aortic  valve replacement. We discussed treatment options in the context of the patient's specific comorbid medical conditions.  I have personally reviewed the patient's echo images today.  Her aortic valve is severely calcified and restricted.  LV function is preserved.  RV function appears normal.  There is moderate mitral regurgitation which I suspect is functional and related to her severe aortic stenosis and elevated LV pressure.  The patient's peak and mean transvalvular gradients are 103 and 66 mmHg respectively.  She clearly has critical aortic stenosis and has minimal symptoms with the exception of her recent heart failure exacerbation.  Aortic valve replacement is indicated and TAVR is likely her only option considering her advanced age of 85 years old.  We reviewed next steps in testing.  The patient is very eager to move forward with her testing and treatment as she wants to get back to driving and all of her regular activities without restrictions.  She will undergo right and left heart catheterization as well as CTA studies of the heart and the chest, abdomen, and pelvis.  She understands that cardiac surgical  evaluation will follow these tests as part of a multidisciplinary approach to her care.  We will move forward with her testing as quickly as possible in this context of critical aortic stenosis.  I have reviewed the risks, indications, and alternatives to cardiac catheterization, possible angioplasty, and stenting with the patient. Risks include but are not limited to bleeding, infection, vascular injury, stroke, myocardial infection, arrhythmia, kidney injury, radiation-related injury in the case of prolonged fluoroscopy use, emergency cardiac surgery, and death. The patient understands the risks of serious complication is 1-2 in 1000 with diagnostic cardiac cath and 1-2% or less with angioplasty/stenting.   Medication Adjustments/Labs and Tests Ordered: Current medicines are reviewed at length with the patient today.  Concerns regarding medicines are outlined above.  Orders Placed This Encounter  Procedures  . CT CORONARY MORPH W/CTA COR W/SCORE W/CA W/CM &/OR WO/CM  . CT ANGIO ABDOMEN PELVIS  W &/OR WO CONTRAST  . CT ANGIO CHEST AORTA W &/OR WO CONTRAST  . Basic metabolic panel  . CBC with Differential/Platelet  . Pulmonary Function Test   No orders of the defined types were placed in this encounter.   Patient Instructions  Medication Instructions:  Your provider recommends that you continue on your current medications as directed. Please refer to the Current Medication list given to you today.    Labwork: TODAY: BMET, CBC  Testing/Procedures: Your physician has requested that you have a cardiac catheterization. Cardiac catheterization is used to diagnose and/or treat various heart conditions. Doctors may recommend this procedure for a number of different reasons. The most common reason is to evaluate chest pain. Chest pain can be a symptom of coronary artery disease (CAD), and cardiac catheterization can show whether plaque is narrowing or blocking your heart's arteries. This procedure  is also used to evaluate the valves, as well as measure the blood flow and oxygen levels in different parts of your heart. For further information please visit [email or web address]. Please follow instruction sheet, as given.  Follow-Up: Your appointment with Eula Listenyan Dunn, PA on February 11 has been cancelled.  The TAVR team will call you to arrange further follow-up.  CARDIAC CATHETERIZATION INSTRUCTIONS:  You are scheduled for a Cardiac Catheterization on Tuesday, February 4 with Dr. Tonny BollmanMichael Laconya Clere.  1. [facility]: [address] [place], [place] [address] at 6:30 AM (  This time is two hours before your procedure to ensure your preparation). Free valet parking service is available.   Special note: Every effort is made to have your procedure done on time. Please understand that emergencies sometimes delay scheduled procedures.  2. Diet: Do not eat solid foods after midnight.  The patient may have clear liquids until 5am upon the day of the procedure.  3. Labs: TODAY.  4. Medication instructions in preparation for your procedure:  1) HOLD LASIX the morning of your catheterization  2) MAKE SURE TO TAKE YOUR ASPIRIN the morning of your procedure  3) You may take other medications as directed with sips of water.  5. Plan for one night stay--bring personal belongings. 6. Bring a current list of your medications and current insurance cards. 7. You MUST have a responsible person to drive you home. 8. Someone MUST be with you the first 24 hours after you arrive home or your discharge will be delayed. 9. Please wear clothes that are easy to get on and off and wear slip-on shoes.  Thank you for allowing [place] to care for you!   -- [facility] Invasive Cardiovascular services     Signed, Tonny Bollman, MD  07/21/2018 1:37 PM    [facility]

## 2018-07-24 ENCOUNTER — Telehealth: Payer: Self-pay | Admitting: *Deleted

## 2018-07-24 NOTE — Telephone Encounter (Addendum)
Pt contacted pre-catheterization scheduled at [facility] for: Tuesday July 25, 2018 10:30 AM Verified arrival time and place: [facility] Main Entrance A at: 6:30 AM-pre procedure hydration per Dr Excell Seltzer  No solid food after midnight prior to cath, clear liquids until 5 AM day of procedure.  Hold: Lasix-day before and day of procedure.-pt has taken today. KCl-day before and day of procedure.-pt has taken today.   Except hold medications AM meds can be  taken pre-cath with sips of water including: ASA 81 mg  Confirmed patient has responsible person to drive home post procedure and observe after arriving home: yes

## 2018-07-25 ENCOUNTER — Other Ambulatory Visit: Payer: Self-pay

## 2018-07-25 ENCOUNTER — Encounter (HOSPITAL_COMMUNITY)
Admission: RE | Disposition: A | Discharge status: Home / Self Care | Payer: Self-pay | Source: Home / Self Care | Attending: Cardiovascular Disease

## 2018-07-25 ENCOUNTER — Ambulatory Visit (HOSPITAL_COMMUNITY)
Admission: RE | Admit: 2018-07-25 | Discharge: 2018-07-26 | Disposition: A | Discharge status: Home / Self Care | Payer: Medicare Other | Attending: Cardiovascular Disease | Admitting: Cardiovascular Disease

## 2018-07-25 ENCOUNTER — Encounter (HOSPITAL_COMMUNITY): Payer: Self-pay | Admitting: General Practice

## 2018-07-25 DIAGNOSIS — Z880 Allergy status to penicillin: Secondary | ICD-10-CM | POA: Diagnosis not present

## 2018-07-25 DIAGNOSIS — N183 Chronic kidney disease, stage 3 unspecified: Secondary | ICD-10-CM | POA: Diagnosis present

## 2018-07-25 DIAGNOSIS — I060 Rheumatic aortic stenosis: Secondary | ICD-10-CM | POA: Diagnosis not present

## 2018-07-25 DIAGNOSIS — I2510 Atherosclerotic heart disease of native coronary artery without angina pectoris: Secondary | ICD-10-CM | POA: Diagnosis not present

## 2018-07-25 DIAGNOSIS — Z8249 Family history of ischemic heart disease and other diseases of the circulatory system: Secondary | ICD-10-CM | POA: Insufficient documentation

## 2018-07-25 DIAGNOSIS — I2584 Coronary atherosclerosis due to calcified coronary lesion: Secondary | ICD-10-CM | POA: Insufficient documentation

## 2018-07-25 DIAGNOSIS — E785 Hyperlipidemia, unspecified: Secondary | ICD-10-CM | POA: Diagnosis present

## 2018-07-25 DIAGNOSIS — Z7982 Long term (current) use of aspirin: Secondary | ICD-10-CM | POA: Insufficient documentation

## 2018-07-25 DIAGNOSIS — I5032 Chronic diastolic (congestive) heart failure: Secondary | ICD-10-CM | POA: Insufficient documentation

## 2018-07-25 DIAGNOSIS — I130 Hypertensive heart and chronic kidney disease with heart failure and stage 1 through stage 4 chronic kidney disease, or unspecified chronic kidney disease: Secondary | ICD-10-CM | POA: Diagnosis not present

## 2018-07-25 DIAGNOSIS — I5033 Acute on chronic diastolic (congestive) heart failure: Secondary | ICD-10-CM | POA: Diagnosis present

## 2018-07-25 DIAGNOSIS — Z79899 Other long term (current) drug therapy: Secondary | ICD-10-CM | POA: Insufficient documentation

## 2018-07-25 DIAGNOSIS — I350 Nonrheumatic aortic (valve) stenosis: Secondary | ICD-10-CM | POA: Diagnosis not present

## 2018-07-25 DIAGNOSIS — I10 Essential (primary) hypertension: Secondary | ICD-10-CM | POA: Diagnosis present

## 2018-07-25 HISTORY — DX: Chronic kidney disease, stage 3 unspecified: N18.30

## 2018-07-25 HISTORY — PX: CARDIAC CATHETERIZATION: SHX172

## 2018-07-25 HISTORY — PX: RIGHT HEART CATH AND CORONARY ANGIOGRAPHY: CATH118264

## 2018-07-25 HISTORY — DX: Chronic kidney disease, stage 3 (moderate): N18.3

## 2018-07-25 LAB — POCT I-STAT EG7
Bicarbonate: 24.7 mmol/L (ref 20.0–28.0)
Calcium, Ion: 1.12 mmol/L — ABNORMAL LOW (ref 1.15–1.40)
HCT: 28 % — ABNORMAL LOW (ref 36.0–46.0)
Hemoglobin: 9.5 g/dL — ABNORMAL LOW (ref 12.0–15.0)
O2 Saturation: 75 %
Potassium: 3.9 mmol/L (ref 3.5–5.1)
Sodium: 142 mmol/L (ref 135–145)
TCO2: 26 mmol/L (ref 22–32)
pCO2, Ven: 39.2 mmHg — ABNORMAL LOW (ref 44.0–60.0)
pH, Ven: 7.407 (ref 7.250–7.430)
pO2, Ven: 40 mmHg (ref 32.0–45.0)

## 2018-07-25 LAB — POCT I-STAT 7, (LYTES, BLD GAS, ICA,H+H)
Acid-Base Excess: 1 mmol/L (ref 0.0–2.0)
BICARBONATE: 24.9 mmol/L (ref 20.0–28.0)
Calcium, Ion: 1.21 mmol/L (ref 1.15–1.40)
HCT: 29 % — ABNORMAL LOW (ref 36.0–46.0)
Hemoglobin: 9.9 g/dL — ABNORMAL LOW (ref 12.0–15.0)
O2 Saturation: 98 %
Potassium: 4.1 mmol/L (ref 3.5–5.1)
SODIUM: 140 mmol/L (ref 135–145)
TCO2: 26 mmol/L (ref 22–32)
pCO2 arterial: 37.3 mmHg (ref 32.0–48.0)
pH, Arterial: 7.433 (ref 7.350–7.450)
pO2, Arterial: 95 mmHg (ref 83.0–108.0)

## 2018-07-25 SURGERY — RIGHT HEART CATH AND CORONARY ANGIOGRAPHY
Anesthesia: LOCAL

## 2018-07-25 MED ORDER — HEPARIN SODIUM (PORCINE) 1000 UNIT/ML IJ SOLN
INTRAMUSCULAR | Status: DC | PRN
  Administered 2018-07-25: 3000 [IU] via INTRAVENOUS

## 2018-07-25 MED ORDER — VERAPAMIL HCL 2.5 MG/ML IV SOLN
INTRAVENOUS | Status: DC | PRN
  Administered 2018-07-25: 10 mL via INTRA_ARTERIAL

## 2018-07-25 MED ORDER — LIDOCAINE HCL (PF) 1 % IJ SOLN
INTRAMUSCULAR | Status: DC | PRN
  Administered 2018-07-25 (×2): 2 mL

## 2018-07-25 MED ORDER — VERAPAMIL HCL 2.5 MG/ML IV SOLN
INTRAVENOUS | Status: AC
  Filled 2018-07-25: qty 2

## 2018-07-25 MED ORDER — ONDANSETRON HCL 4 MG/2ML IJ SOLN: 4.0000 mg | Freq: Four times a day (QID) | INTRAMUSCULAR | Status: DC | PRN

## 2018-07-25 MED ORDER — HEPARIN (PORCINE) IN NACL 1000-0.9 UT/500ML-% IV SOLN
INTRAVENOUS | Status: DC | PRN
  Administered 2018-07-25 (×3): 500 mL

## 2018-07-25 MED ORDER — VITAMIN D 25 MCG (1000 UNIT) PO TABS
2000.0000 [IU] | ORAL_TABLET | Freq: Two times a day (BID) | ORAL | Status: DC
  Administered 2018-07-25 – 2018-07-26 (×3): 2000 [IU] via ORAL
  Filled 2018-07-25 (×3): qty 2

## 2018-07-25 MED ORDER — POTASSIUM CHLORIDE CRYS ER 10 MEQ PO TBCR
10.0000 meq | EXTENDED_RELEASE_TABLET | ORAL | Status: DC
  Administered 2018-07-26: 10 meq via ORAL
  Filled 2018-07-25 (×2): qty 1

## 2018-07-25 MED ORDER — SODIUM CHLORIDE 0.9% FLUSH: 3.0000 mL | INTRAVENOUS | Status: DC | PRN

## 2018-07-25 MED ORDER — HEPARIN (PORCINE) IN NACL 1000-0.9 UT/500ML-% IV SOLN
INTRAVENOUS | Status: AC
  Filled 2018-07-25: qty 1500

## 2018-07-25 MED ORDER — ASPIRIN 81 MG PO CHEW: 81.0000 mg | CHEWABLE_TABLET | ORAL | Status: DC

## 2018-07-25 MED ORDER — ASPIRIN EC 81 MG PO TBEC
81.0000 mg | DELAYED_RELEASE_TABLET | Freq: Every day | ORAL | Status: DC
  Administered 2018-07-26: 81 mg via ORAL
  Filled 2018-07-25: qty 1

## 2018-07-25 MED ORDER — ACETAMINOPHEN 325 MG PO TABS: 650.0000 mg | ORAL_TABLET | ORAL | Status: DC | PRN

## 2018-07-25 MED ORDER — SODIUM CHLORIDE 0.9 % IV SOLN: 250.0000 mL | INTRAVENOUS | Status: DC | PRN

## 2018-07-25 MED ORDER — VITAMIN C 500 MG PO TABS
2000.0000 mg | ORAL_TABLET | Freq: Every day | ORAL | Status: DC
  Administered 2018-07-25 – 2018-07-26 (×2): 2000 mg via ORAL
  Filled 2018-07-25 (×2): qty 4

## 2018-07-25 MED ORDER — SODIUM CHLORIDE 0.9 % WEIGHT BASED INFUSION: 1.0000 mL/kg/h | INTRAVENOUS | Status: DC

## 2018-07-25 MED ORDER — IOHEXOL 350 MG/ML SOLN
INTRAVENOUS | Status: DC | PRN
  Administered 2018-07-25: 30 mL via INTRACARDIAC

## 2018-07-25 MED ORDER — ADULT MULTIVITAMIN W/MINERALS CH
1.0000 | ORAL_TABLET | Freq: Every day | ORAL | Status: DC
  Administered 2018-07-25 – 2018-07-26 (×2): 1 via ORAL
  Filled 2018-07-25 (×2): qty 1

## 2018-07-25 MED ORDER — SODIUM CHLORIDE 0.9 % WEIGHT BASED INFUSION: 1.0000 mL/kg/h | INTRAVENOUS | Status: AC

## 2018-07-25 MED ORDER — SODIUM CHLORIDE 0.9 % WEIGHT BASED INFUSION
3.0000 mL/kg/h | INTRAVENOUS | Status: DC
  Administered 2018-07-25: 3 mL/kg/h via INTRAVENOUS

## 2018-07-25 MED ORDER — MIDAZOLAM HCL 2 MG/2ML IJ SOLN
INTRAMUSCULAR | Status: DC | PRN
  Administered 2018-07-25: 1 mg via INTRAVENOUS

## 2018-07-25 MED ORDER — SODIUM CHLORIDE 0.9% FLUSH
3.0000 mL | Freq: Two times a day (BID) | INTRAVENOUS | Status: DC
  Administered 2018-07-26: 3 mL via INTRAVENOUS

## 2018-07-25 MED ORDER — ATENOLOL 50 MG PO TABS
50.0000 mg | ORAL_TABLET | Freq: Every evening | ORAL | Status: DC
  Filled 2018-07-25: qty 1

## 2018-07-25 MED ORDER — FUROSEMIDE 20 MG PO TABS
20.0000 mg | ORAL_TABLET | ORAL | Status: DC
  Administered 2018-07-26: 20 mg via ORAL
  Filled 2018-07-25: qty 1

## 2018-07-25 MED ORDER — LIDOCAINE HCL (PF) 1 % IJ SOLN
INTRAMUSCULAR | Status: AC
  Filled 2018-07-25: qty 30

## 2018-07-25 MED ORDER — MIDAZOLAM HCL 2 MG/2ML IJ SOLN
INTRAMUSCULAR | Status: AC
  Filled 2018-07-25: qty 2

## 2018-07-25 MED ORDER — SODIUM CHLORIDE 0.9% FLUSH: 3.0000 mL | Freq: Two times a day (BID) | INTRAVENOUS | Status: DC

## 2018-07-25 MED ORDER — SIMVASTATIN 20 MG PO TABS
20.0000 mg | ORAL_TABLET | Freq: Every evening | ORAL | Status: DC
  Administered 2018-07-25: 20 mg via ORAL
  Filled 2018-07-25: qty 1

## 2018-07-25 SURGICAL SUPPLY — 12 items

## 2018-07-25 NOTE — Discharge Instructions (Signed)
Radial Site Care ° °This sheet gives you information about how to care for yourself after your procedure. Your health care provider may also give you more specific instructions. If you have problems or questions, contact your health care provider. °What can I expect after the procedure? °After the procedure, it is common to have: °· Bruising and tenderness at the catheter insertion area. °Follow these instructions at home: °Medicines °· Take over-the-counter and prescription medicines only as told by your health care provider. °Insertion site care °· Follow instructions from your health care provider about how to take care of your insertion site. Make sure you: °? Wash your hands with soap and water before you change your bandage (dressing). If soap and water are not available, use hand sanitizer. °? Change your dressing as told by your health care provider. °? Leave stitches (sutures), skin glue, or adhesive strips in place. These skin closures may need to stay in place for 2 weeks or longer. If adhesive strip edges start to loosen and curl up, you may trim the loose edges. Do not remove adhesive strips completely unless your health care provider tells you to do that. °· Check your insertion site every day for signs of infection. Check for: °? Redness, swelling, or pain. °? Fluid or blood. °? Pus or a bad smell. °? Warmth. °· Do not take baths, swim, or use a hot tub until your health care provider approves. °· You may shower 24-48 hours after the procedure, or as directed by your health care provider. °? Remove the dressing and gently wash the site with plain soap and water. °? Pat the area dry with a clean towel. °? Do not rub the site. That could cause bleeding. °· Do not apply powder or lotion to the site. °Activity ° °· For 24 hours after the procedure, or as directed by your health care provider: °? Do not flex or bend the affected arm. °? Do not push or pull heavy objects with the affected arm. °? Do not  drive yourself home from the hospital or clinic. You may drive 24 hours after the procedure unless your health care provider tells you not to. °? Do not operate machinery or power tools. °· Do not lift anything that is heavier than 10 lb (4.5 kg), or the limit that you are told, until your health care provider says that it is safe. °· Ask your health care provider when it is okay to: °? Return to work or school. °? Resume usual physical activities or sports. °? Resume sexual activity. °General instructions °· If the catheter site starts to bleed, raise your arm and put firm pressure on the site. If the bleeding does not stop, get help right away. This is a medical emergency. °· If you went home on the same day as your procedure, a responsible adult should be with you for the first 24 hours after you arrive home. °· Keep all follow-up visits as told by your health care provider. This is important. °Contact a health care provider if: °· You have a fever. °· You have redness, swelling, or yellow drainage around your insertion site. °Get help right away if: °· You have unusual pain at the radial site. °· The catheter insertion area swells very fast. °· The insertion area is bleeding, and the bleeding does not stop when you hold steady pressure on the area. °· Your arm or hand becomes pale, cool, tingly, or numb. °These symptoms may represent a serious problem   that is an emergency. Do not wait to see if the symptoms will go away. Get medical help right away. Call your local emergency services (911 in the [place] Do not drive yourself to the hospital. °Summary °· After the procedure, it is common to have bruising and tenderness at the site. °· Follow instructions from your health care provider about how to take care of your radial site wound. Check the wound every day for signs of infection. °· Do not lift anything that is heavier than 10 lb (4.5 kg), or the limit that you are told, until your health care provider says  that it is safe. °This information is not intended to replace advice given to you by your health care provider. Make sure you discuss any questions you have with your health care provider. °Document Released: 07/10/2010 Document Revised: 07/13/2017 Document Reviewed: 07/13/2017 °Elsevier Interactive Patient Education © 2019 [employer] ° °

## 2018-07-25 NOTE — Progress Notes (Signed)
Report called to 3 east.

## 2018-07-25 NOTE — Progress Notes (Signed)
TR band is removed no bleeding site looks dry and has some old drainage. Patient is educated once again to not use that arm.

## 2018-07-25 NOTE — Progress Notes (Signed)
Patient is bradycardic in 50s. Will hold BB.  Hold discharge as patient wants to stay overnight.

## 2018-07-25 NOTE — Progress Notes (Signed)
Received patient from cath lab, patient has no complain of pain. She has 11 cc of air in the TR band. She is in bed resting and patient is educated about not to use that right arm.

## 2018-07-25 NOTE — Progress Notes (Signed)
Patient has been educated about not moving or using her right arm but she keeps using it. I had to put air in the TR band twice. I keep educating her about not using the arm. Will continue to monitor.

## 2018-07-25 NOTE — Progress Notes (Signed)
Patient was set on the dinamap for frequent vital sign after the cath lab, but they did not cross over and did not show on the dinamap. The current blood pressure 121/43  is and the patient heart rate is 59. Patient is in bed with no complains.

## 2018-07-25 NOTE — Interval H&P Note (Signed)
History and Physical Interval Note:  07/25/2018 11:39 AM  Natalie Sherman  has presented today for surgery, with the diagnosis of Critical  The various methods of treatment have been discussed with the patient and family. After consideration of risks, benefits and other options for treatment, the patient has consented to  Procedure(s): RIGHT/LEFT HEART CATH AND CORONARY ANGIOGRAPHY (N/A) as a surgical intervention .  The patient's history has been reviewed, patient examined, no change in status, stable for surgery.  I have reviewed the patient's chart and labs.  Questions were answered to the patient's satisfaction.     Tonny Bollman

## 2018-07-26 ENCOUNTER — Encounter (HOSPITAL_COMMUNITY): Payer: Self-pay | Admitting: Cardiovascular Disease

## 2018-07-26 DIAGNOSIS — N183 Chronic kidney disease, stage 3 unspecified: Secondary | ICD-10-CM | POA: Diagnosis present

## 2018-07-26 DIAGNOSIS — I350 Nonrheumatic aortic (valve) stenosis: Secondary | ICD-10-CM | POA: Diagnosis not present

## 2018-07-26 DIAGNOSIS — I2510 Atherosclerotic heart disease of native coronary artery without angina pectoris: Secondary | ICD-10-CM | POA: Diagnosis not present

## 2018-07-26 DIAGNOSIS — E785 Hyperlipidemia, unspecified: Secondary | ICD-10-CM | POA: Diagnosis present

## 2018-07-26 DIAGNOSIS — I130 Hypertensive heart and chronic kidney disease with heart failure and stage 1 through stage 4 chronic kidney disease, or unspecified chronic kidney disease: Secondary | ICD-10-CM | POA: Diagnosis not present

## 2018-07-26 DIAGNOSIS — I2584 Coronary atherosclerosis due to calcified coronary lesion: Secondary | ICD-10-CM | POA: Diagnosis not present

## 2018-07-26 DIAGNOSIS — I060 Rheumatic aortic stenosis: Secondary | ICD-10-CM | POA: Diagnosis not present

## 2018-07-26 DIAGNOSIS — I5032 Chronic diastolic (congestive) heart failure: Secondary | ICD-10-CM | POA: Diagnosis not present

## 2018-07-26 NOTE — Discharge Summary (Addendum)
[facility]   MULTIDISCIPLINARY HEART VALVE TEAM  Discharge Summary    Patient ID: Natalie Sherman MRN: [id number]; DOB: 06-21-1922  Admit date: 07/25/2018 Discharge date: 07/26/2018  Primary Care Provider: Lupita Raider, MD  Primary Cardiologist: Dr. Mariah Milling & Eula Listen PA-C  Discharge Diagnoses    Principal Problem:   Severe aortic stenosis Active Problems:   Chronic diastolic heart failure (HCC)   HTN (hypertension)   Hyperlipidemia   CKD (chronic kidney disease) stage 3, GFR 30-59 ml/min (HCC)   Allergies Allergies  Allergen Reactions  . Penicillins Rash    DID THE REACTION INVOLVE: Swelling of the face/tongue/throat, SOB, or low BP? No Sudden or severe rash/hives, skin peeling, or the inside of the mouth or nose? No Did it require medical treatment? No When did it last happen?more than 10 years ago If all above answers are "NO", may proceed with cephalosporin use.     Diagnostic Studies/Procedures    07/25/2018 RIGHT HEART CATH AND CORONARY ANGIOGRAPHY  Conclusion    Mid RCA lesion is 30% stenosed.  There is severe aortic valve stenosis.   1.  Calcified coronary arteries with minor nonobstructive CAD 2.  Severely calcified aortic valve biplane fluoroscopy with known critical aortic stenosis by noninvasive assessment 3.  Moderate mitral annular calcification biplane fluoroscopy  Recommendations: Continue multidisciplinary TAVR evaluation   _____________   History of Present Illness     Natalie Sherman is a 85 y.o. female with a history of HTN, HLD, mild renal insuficency and critical aortic stenosis who presented to [facility] on 07/25/18 for diagnostic cath as a part of her work up for potential TAVR.   She has a history of a heart murmur with possible rheumatic fever as a child. Otherwise, she has been remarkably healthy her whole life. She lives alone and performs all of her own ADLs. She was in her usual state of health until 05/2018 when  she was admitted for acute CHF and found to have critical AS with mean gradient of 66 mm Hg and AVA of 0.52 cm^2.   She was referred to Dr Excell Seltzer with the multidisciplinary valve team for TAVR evaluation and she was set up for [facility] on 07/25/2018.    Hospital Course     Consultants: none  Diagnostic cath yesterday showed minor non obstructive CAD and moderate mitral annular calcification. She was kept overnight because she did not have someone to stay the night with her. Radial site healing well. The rest of her TAVR follow up has been arranged and outlined for her in a separate letter provided to the patient. She will be discharged home on her same medical regimen. _____________  Discharge Vitals Blood pressure (!) 152/61, pulse (!) 59, temperature 98 F (36.7 C), temperature source Oral, resp. rate 18, height 5\' 3"  (1.6 m), weight 60 kg, SpO2 99 %.  Filed Weights   07/25/18 0625 07/26/18 0633  Weight: 61.7 kg 60 kg    Labs & Radiologic Studies    CBC Recent Labs    07/25/18 1200  HGB 9.9*  9.5*  HCT 29.0*  28.0*   Basic Metabolic Panel Recent Labs    37/90/24 1200  NA 140  142  K 4.1  3.9   Liver Function Tests No results for input(s): AST, ALT, ALKPHOS, BILITOT, PROT, ALBUMIN in the last 72 hours. No results for input(s): LIPASE, AMYLASE in the last 72 hours. Cardiac Enzymes No results for input(s): CKTOTAL, CKMB, CKMBINDEX, TROPONINI in the  last 72 hours. BNP Invalid input(s): POCBNP D-Dimer No results for input(s): DDIMER in the last 72 hours. Hemoglobin A1C No results for input(s): HGBA1C in the last 72 hours. Fasting Lipid Panel No results for input(s): CHOL, HDL, LDLCALC, TRIG, CHOLHDL, LDLDIRECT in the last 72 hours. Thyroid Function Tests No results for input(s): TSH, T4TOTAL, T3FREE, THYROIDAB in the last 72 hours.  Invalid input(s): FREET3 _____________  No results found. Disposition   Pt is being discharged home today in good  condition.  Follow-up Plans & Appointments    Follow-up Information    Lupita Raider, MD.   Specialty:  Family Medicine Contact information: [address] [place] [address] [phone]            Discharge Medications   Allergies as of 07/26/2018      Reactions   Penicillins Rash   DID THE REACTION INVOLVE: Swelling of the face/tongue/throat, SOB, or low BP? No Sudden or severe rash/hives, skin peeling, or the inside of the mouth or nose? No Did it require medical treatment? No When did it last happen?more than 10 years ago If all above answers are "NO", may proceed with cephalosporin use.      Medication List    TAKE these medications   aspirin 81 MG EC tablet Take 1 tablet (81 mg total) by mouth daily.   atenolol 50 MG tablet Commonly known as:  TENORMIN Take 1 tablet (50 mg total) by mouth daily. What changed:  when to take this   CALCIUM PO Take 2 tablets by mouth daily.   cholecalciferol 25 MCG (1000 UT) tablet Commonly known as:  VITAMIN D3 Take 2,000 Units by mouth 2 (two) times daily.   Fish Oil 1000 MG Caps Take 2,000 mg by mouth daily.   furosemide 20 MG tablet Commonly known as:  LASIX Take 1 tablet (20 mg) by mouth once every other day What changed:    how much to take  how to take this  when to take this  additional instructions   multivitamin with minerals Tabs tablet Take 1 tablet by mouth daily. Centrum Silver   potassium chloride 10 MEQ tablet Commonly known as:  K-DUR Take 1 tablet (10 meq) by mouth every other day What changed:    how much to take  how to take this  when to take this  additional instructions   PRESERVISION AREDS 2 PO Take 1 tablet by mouth 2 (two) times daily.   REFRESH OP Place 1 drop into both eyes 2 (two) times daily.   simvastatin 20 MG tablet Commonly known as:  ZOCOR Take 1 tablet (20 mg total) by mouth daily. What changed:  when to take this   vitamin C  1000 MG tablet Take 2,000 mg by mouth daily.         Outstanding Labs/Studies   none  Duration of Discharge Encounter   Greater than 30 minutes including physician time.  Byrd Hesselbach, PA-C 07/26/2018, 10:28 AM [phone]  Patient seen, examined. Available data reviewed. Agree with findings, assessment, and plan as outlined by Cline Crock, PA-C.  The patient is an alert, oriented, elderly woman in no distress.  Her right radial and antecubital sites are clear.  Heart is regular rate and rhythm with a grade 4/6 harsh systolic murmur at the right upper sternal border.  No diastolic murmur.  Lungs are clear.  There is no pretibial edema present.  The patient is stable for discharge today.  She is been given instructions about continued multidisciplinary heart team evaluation for TAVR.  Tonny Bollman, M.D. 07/26/2018 1:02 PM

## 2018-07-27 ENCOUNTER — Other Ambulatory Visit: Payer: Self-pay

## 2018-07-27 DIAGNOSIS — N289 Disorder of kidney and ureter, unspecified: Secondary | ICD-10-CM

## 2018-07-27 DIAGNOSIS — I350 Nonrheumatic aortic (valve) stenosis: Secondary | ICD-10-CM

## 2018-08-01 ENCOUNTER — Ambulatory Visit: Payer: Medicare Other | Admitting: Physician Assistant

## 2018-08-08 ENCOUNTER — Ambulatory Visit (HOSPITAL_COMMUNITY)
Admission: RE | Admit: 2018-08-08 | Discharge: 2018-08-08 | Disposition: A | Discharge status: Home / Self Care | Payer: Medicare Other | Source: Ambulatory Visit | Attending: Cardiovascular Disease | Admitting: Cardiovascular Disease

## 2018-08-08 ENCOUNTER — Ambulatory Visit (HOSPITAL_COMMUNITY)
Admit: 2018-08-08 | Discharge: 2018-08-08 | Disposition: A | Discharge status: Home / Self Care | Payer: Medicare Other | Attending: Cardiovascular Disease | Admitting: Cardiovascular Disease

## 2018-08-08 DIAGNOSIS — N289 Disorder of kidney and ureter, unspecified: Secondary | ICD-10-CM | POA: Insufficient documentation

## 2018-08-08 DIAGNOSIS — I350 Nonrheumatic aortic (valve) stenosis: Secondary | ICD-10-CM

## 2018-08-08 DIAGNOSIS — K5790 Diverticulosis of intestine, part unspecified, without perforation or abscess without bleeding: Secondary | ICD-10-CM | POA: Insufficient documentation

## 2018-08-08 DIAGNOSIS — R918 Other nonspecific abnormal finding of lung field: Secondary | ICD-10-CM | POA: Insufficient documentation

## 2018-08-08 LAB — PULMONARY FUNCTION TEST
DL/VA: 4.19 ml/min/mmHg/L
DLCO UNC: 9.96 ml/min/mmHg
DLCO cor: 11.64 ml/min/mmHg
FEF 25-75 Post: 1.45 L/sec
FEF 25-75 Pre: 1.33 L/sec
FEF2575-%Change-Post: 8 %
FEF2575-%Pred-Post: 267 %
FEF2575-%Pred-Pre: 246 %
FEV1-%CHANGE-POST: 1 %
FEV1-%Pred-Post: 117 %
FEV1-%Pred-Pre: 115 %
FEV1-Post: 1.5 L
FEV1-Pre: 1.48 L
FEV1FVC-%Change-Post: 0 %
FEV1FVC-%PRED-PRE: 111 %
FEV6-%Change-Post: 1 %
FEV6-%Pred-Post: 116 %
FEV6-%Pred-Pre: 115 %
FEV6-PRE: 1.87 L
FEV6-Post: 1.89 L
FEV6FVC-%Change-Post: 0 %
FEV6FVC-%PRED-PRE: 108 %
FEV6FVC-%Pred-Post: 108 %
FVC-%Change-Post: 1 %
FVC-%PRED-POST: 107 %
FVC-%Pred-Pre: 106 %
FVC-Post: 1.89 L
FVC-Pre: 1.87 L
POST FEV1/FVC RATIO: 79 %
Post FEV6/FVC ratio: 100 %
Pre FEV1/FVC ratio: 79 %
Pre FEV6/FVC Ratio: 100 %
RV % pred: 73 %
RV: 1.95 L
TLC % pred: 78 %
TLC: 3.86 L

## 2018-08-08 LAB — BASIC METABOLIC PANEL
ANION GAP: 10 (ref 5–15)
BUN: 20 mg/dL (ref 8–23)
CO2: 26 mmol/L (ref 22–32)
Calcium: 9.7 mg/dL (ref 8.9–10.3)
Chloride: 104 mmol/L (ref 98–111)
Creatinine, Ser: 1.02 mg/dL — ABNORMAL HIGH (ref 0.44–1.00)
GFR calc Af Amer: 54 mL/min — ABNORMAL LOW (ref >60)
GFR calc non Af Amer: 47 mL/min — ABNORMAL LOW (ref >60)
Glucose, Bld: 122 mg/dL — ABNORMAL HIGH (ref 70–99)
Potassium: 4.3 mmol/L (ref 3.5–5.1)
Sodium: 140 mmol/L (ref 135–145)

## 2018-08-08 MED ORDER — SODIUM BICARBONATE 8.4 % IV SOLN
INTRAVENOUS | Status: AC
  Administered 2018-08-08: 11:00:00 via INTRAVENOUS
  Filled 2018-08-08: qty 500

## 2018-08-08 MED ORDER — IOPAMIDOL (ISOVUE-370) INJECTION 76%
150.0000 mL | Freq: Once | INTRAVENOUS | Status: AC | PRN
  Administered 2018-08-08: 150 mL via INTRAVENOUS

## 2018-08-08 MED ORDER — ALBUTEROL SULFATE (2.5 MG/3ML) 0.083% IN NEBU
2.5000 mg | INHALATION_SOLUTION | Freq: Once | RESPIRATORY_TRACT | Status: AC
  Administered 2018-08-08: 2.5 mg via RESPIRATORY_TRACT

## 2018-08-08 MED ORDER — SODIUM BICARBONATE BOLUS VIA INFUSION
INTRAVENOUS | Status: AC
  Administered 2018-08-08: 1 meq via INTRAVENOUS
  Filled 2018-08-08: qty 1

## 2018-08-08 NOTE — Progress Notes (Signed)
Notified [name] in radiology, pt bicarb drip started at 0933.  Will continue to monitor

## 2018-08-08 NOTE — Progress Notes (Signed)
Bilateral carotid duplex completed - Preliminary results found in Chart Review CV Proc. [place] Zaniyah Wernette,RVS 08/08/2018, 3:04 PM

## 2018-08-09 ENCOUNTER — Other Ambulatory Visit: Payer: Self-pay

## 2018-08-09 ENCOUNTER — Ambulatory Visit: Payer: Medicare Other | Attending: Cardiovascular Disease | Admitting: Physical Therapy

## 2018-08-09 ENCOUNTER — Encounter: Payer: Self-pay | Admitting: Physical Therapy

## 2018-08-09 ENCOUNTER — Encounter: Payer: Self-pay | Admitting: Surgery

## 2018-08-09 ENCOUNTER — Institutional Professional Consult (permissible substitution) (INDEPENDENT_AMBULATORY_CARE_PROVIDER_SITE_OTHER): Payer: Medicare Other | Admitting: Surgery

## 2018-08-09 VITALS — BP 142/72 | HR 64 | Resp 20 | Ht 63.0 in | Wt 135.0 lb

## 2018-08-09 DIAGNOSIS — I350 Nonrheumatic aortic (valve) stenosis: Secondary | ICD-10-CM

## 2018-08-09 DIAGNOSIS — R2689 Other abnormalities of gait and mobility: Secondary | ICD-10-CM | POA: Diagnosis not present

## 2018-08-10 ENCOUNTER — Encounter: Payer: Self-pay | Admitting: Surgery

## 2018-08-10 NOTE — Progress Notes (Signed)
Patient ID: Natalie Sherman, female   DOB: [birth year], 85 y.o.   MRN: [id number]  [facility]  MULTIDISCIPLINARY HEART VALVE CLINIC  CARDIOTHORACIC SURGERY CONSULTATION REPORT  Referring Provider is Iran Ouch, MD PCP is Lupita Raider, MD  Chief Complaint  Patient presents with  . Aortic Stenosis    Surgical eval for TAVR, review all studies    HPI:  The patient is a 85 year old active and functional woman with history of hypertension, hyperlipidemia, stage III chronic kidney disease, aortic stenosis, and chronic diastolic congestive heart failure.  She said that she was in her usual state of health with minimal symptoms until she woke up in the morning on 06/17/2019 and felt like she could not breathe.  She was taken to the emergency room at [facility] by her family and diagnosed with pulmonary edema and acute congestive heart failure.  She improved with diuresis.  An echocardiogram on 06/17/2018 showed a trileaflet aortic valve with mild calcification and thickening of the leaflets.  The mean gradient across the valve was measured at 40 mmHg with a peak gradient of 98 mmHg.  The dimensionless index was 0.16 and the valve area was measured at 0.52 cm.  Left ventricular ejection fraction was 65%.  There was moderate mitral regurgitation.  Since discharge on 06/20/2018 she said that she is felt fairly well.  She has been doing some exercises sitting in a chair.  She was riding a stationary bicycle prior to her hospitalization but has not gotten back to that yet.  She was previously driving a car but has not been driving since her diagnosis of severe aortic stenosis.  She feels like her energy level is good although she is not very active at this time.  She denies any chest pain or pressure.  She has had no dizziness or syncope.  She denies orthopnea and PND.  She reports a history of a heart murmur as a child and was told that she had rheumatic fever.  She never had an echo until  her recent admission.  She is here today with her daughter and son-in-law.  She has been widowed for over 30 years and has lived alone.  She has 2 daughters who live in the area.  She also has a niece who is a retired Estate agent who comes by to check on her every day.  Past Medical History:  Diagnosis Date  . Chronic diastolic CHF (congestive heart failure) (HCC)    a. EF of 60 to 65%, moderate LVH, no regional wall motion abnormalities, grade 1 diastolic dysfunction, severe aortic stenosis with a valve area less than 0.5 cm, mild aortic insufficiency, moderate mitral regurgitation  . CKD (chronic kidney disease) stage 3, GFR 30-59 ml/min (HCC)   . Hyperlipidemia   . Hypertension   . Severe aortic stenosis     Past Surgical History:  Procedure Laterality Date  . CARDIAC CATHETERIZATION  07/25/2018  . DILATION AND CURETTAGE OF UTERUS  1968  . RIGHT HEART CATH AND CORONARY ANGIOGRAPHY N/A 07/25/2018   Procedure: RIGHT HEART CATH AND CORONARY ANGIOGRAPHY;  Surgeon: Tonny Bollman, MD;  Location: [facility] INVASIVE CV LAB;  Service: Cardiovascular;  Laterality: N/A;    Family History  Problem Relation Age of Onset  . Diabetes Mellitus II Mother   . Heart disease Father   . COPD Father     Social History   Socioeconomic History  . Marital status: Widowed    Spouse name: Not on file  .  Number of children: Not on file  . Years of education: Not on file  . Highest education level: Not on file  Occupational History  . Occupation: retired  Engineer, production  . Financial resource strain: Not on file  . Food insecurity:    Worry: Not on file    Inability: Not on file  . Transportation needs:    Medical: Not on file    Non-medical: Not on file  Tobacco Use  . Smoking status: Never Smoker  . Smokeless tobacco: Never Used  Substance and Sexual Activity  . Alcohol use: Never    Frequency: Never  . Drug use: Never  . Sexual activity: Not Currently  Lifestyle  . Physical activity:     Days per week: Not on file    Minutes per session: Not on file  . Stress: Not on file  Relationships  . Social connections:    Talks on phone: Not on file    Gets together: Not on file    Attends religious service: Not on file    Active member of club or organization: Not on file    Attends meetings of clubs or organizations: Not on file    Relationship status: Not on file  . Intimate partner violence:    Fear of current or ex partner: Not on file    Emotionally abused: Not on file    Physically abused: Not on file    Forced sexual activity: Not on file  Other Topics Concern  . Not on file  Social History Narrative  . Not on file    Current Outpatient Medications  Medication Sig Dispense Refill  . Ascorbic Acid (VITAMIN C) 1000 MG tablet Take 2,000 mg by mouth daily.    Marland Kitchen aspirin 81 MG EC tablet Take 1 tablet (81 mg total) by mouth daily. 30 tablet 2  . atenolol (TENORMIN) 50 MG tablet Take 1 tablet (50 mg total) by mouth daily. (Patient taking differently: Take 50 mg by mouth every evening. ) 90 tablet 0  . CALCIUM PO Take 2 tablets by mouth daily.    . cholecalciferol (VITAMIN D3) 25 MCG (1000 UT) tablet Take 2,000 Units by mouth 2 (two) times daily.     . furosemide (LASIX) 20 MG tablet Take 1 tablet (20 mg) by mouth once every other day (Patient taking differently: Take 20 mg by mouth every other day. IN THE MORNING.) 45 tablet 1  . Multiple Vitamin (MULTIVITAMIN WITH MINERALS) TABS tablet Take 1 tablet by mouth daily. Centrum Silver    . Multiple Vitamins-Minerals (PRESERVISION AREDS 2 PO) Take 1 tablet by mouth 2 (two) times daily.    . Omega-3 Fatty Acids (FISH OIL) 1000 MG CAPS Take 2,000 mg by mouth daily.     . Polyvinyl Alcohol-Povidone (REFRESH OP) Place 1 drop into both eyes 2 (two) times daily.    . potassium chloride (K-DUR) 10 MEQ tablet Take 1 tablet (10 meq) by mouth every other day (Patient taking differently: Take 10 mEq by mouth every other day. IN THE MORNING)  45 tablet 1  . simvastatin (ZOCOR) 20 MG tablet Take 1 tablet (20 mg total) by mouth daily. (Patient taking differently: Take 20 mg by mouth every evening. ) 90 tablet 0   No current facility-administered medications for this visit.     Allergies  Allergen Reactions  . Penicillins Rash    DID THE REACTION INVOLVE: Swelling of the face/tongue/throat, SOB, or low BP? No Sudden or severe rash/hives,  skin peeling, or the inside of the mouth or nose? No Did it require medical treatment? No When did it last happen?more than 10 years ago If all above answers are "NO", may proceed with cephalosporin use.       Review of Systems:   General:  normal appetite, decreased energy, no weight gain, no weight loss, no fever  Cardiac:  no chest pain with exertion, no chest pain at rest, no SOB with exertion, no resting SOB, no PND, no orthopnea, no palpitations, no arrhythmia, no atrial fibrillation, + mild LE edema, no dizzy spells, no syncope  Respiratory:  no shortness of breath, no home oxygen, no productive cough, no dry cough, no bronchitis, no wheezing, no hemoptysis, no asthma, no pain with inspiration or cough, no sleep apnea, no CPAP at night  GI:   no difficulty swallowing, no reflux, no frequent heartburn, no hiatal hernia, no abdominal pain, no constipation, no diarrhea, no hematochezia, no hematemesis, no melena  GU:   no dysuria,  no frequency, no urinary tract infection, no hematuria, no kidney stones, + chronic kidney disease  Vascular:  no pain suggestive of claudication, no pain in feet, no leg cramps, no varicose veins, no DVT, no non-healing foot ulcer  Neuro:   no stroke, no TIA's, no seizures, no headaches, no temporary blindness one eye,  no slurred speech, no peripheral neuropathy, no chronic pain, no instability of gait, no memory/cognitive dysfunction  Musculoskeletal: + arthritis, no joint swelling, no myalgias, no difficulty walking, normal mobility   Skin:   no rash, no  itching, no skin infections, no pressure sores or ulcerations  Psych:   no anxiety, no depression, no nervousness, no unusual recent stress  Eyes:   no blurry vision, no floaters, no recent vision changes, + wears glasses   ENT:   + hearing loss, no loose or painful teeth, no dentures, last saw dentist 10/19  Hematologic:  no easy bruising, no abnormal bleeding, no clotting disorder, no frequent epistaxis  Endocrine:  no diabetes, does not check CBG's at home        Physical Exam:   BP (!) 142/72   Pulse 64   Resp 20   Ht 5\' 3"  (1.6 m)   Wt 135 lb (61.2 kg)   SpO2 95% Comment: RA  BMI 23.91 kg/m   General:  Elderly but  well-appearing  HEENT:  Unremarkable, NCAT, PERLA, EOMI, oropharynx clear, teeth in good condition  Neck:   no JVD, no bruits, no adenopathy or thyromegaly  Chest:   clear to auscultation, symmetrical breath sounds, no wheezes, no rhonchi   CV:   RRR, grade III/VI crescendo/decrescendo murmur heard best at RSB,  no diastolic murmur  Abdomen:  soft, non-tender, no masses   Extremities:  warm, well-perfused, pulses palpable in feet, moderate  LE edema in ankles and feet.  Rectal/GU  Deferred  Neuro:   Grossly non-focal and symmetrical throughout  Skin:   Clean and dry, no rashes, no breakdown   Diagnostic Tests:    [facility]*                       [address]                        [place], [place] [id number]  [phone]  ------------------------------------------------------------------- Transthoracic Echocardiography  Patient:    Tephanie, Escorcia MR #:       [id number] Study Date: 06/17/2018 Gender:     F Age:        47 Height:     162.6 cm Weight:     60.1 kg BSA:        1.65 m^2 Pt. Status: Room:       251A   SONOGRAPHER  [facility]  PERFORMING   [facility]  ADMITTING    Pyreddy, Pavan  ORDERING     Pyreddy, Pavan  REFERRING    Pyreddy, Pavan  ATTENDING    Salary, Montell  D  cc:  ------------------------------------------------------------------- LV EF: 60% -   65%  ------------------------------------------------------------------- Indications:      CHF - 428.0.  ------------------------------------------------------------------- History:   Risk factors:  Dyslipidemia.  ------------------------------------------------------------------- Study Conclusions  - Left ventricle: The cavity size was normal. Wall thickness was   increased in a pattern of moderate LVH. There was moderate   concentric hypertrophy. Systolic function was normal. The   estimated ejection fraction was in the range of 60% to 65%. Wall   motion was normal; there were no regional wall motion   abnormalities. Doppler parameters are consistent with abnormal   left ventricular relaxation (grade 1 diastolic dysfunction). - Aortic valve: There was mild regurgitation. Valve area (VTI):   0.52 cm^2. Valve area (Vmax): 0.42 cm^2. Valve area (Vmean): 0.52   cm^2. - Mitral valve: There was moderate regurgitation.  Impressions:  - Normal LVF   Normal Wall Motion   EF=65%   Normal Right side   Mod LVH   Critical AS   Valve Area<0.5cm2. Normal study.  ------------------------------------------------------------------- Study data:   Study status:  Routine.  Procedure:  Transthoracic echocardiography. Image quality was adequate.  Study completion: There were no complications.          Transthoracic echocardiography.  M-mode, complete 2D, spectral Doppler, and color Doppler.  Birthdate:  Patient birthdate: 1922-06-15.  Age:  Patient is 85 yr old.  Sex:  Gender: female.    BMI: 22.7 kg/m^2.  Blood pressure:     120/50  Patient status:  Inpatient.  Study date: Study date: 06/17/2018. Study time: 07:19 AM.  -------------------------------------------------------------------  ------------------------------------------------------------------- Left ventricle:  The cavity size  was normal. Wall thickness was increased in a pattern of moderate LVH. There was moderate concentric hypertrophy. Systolic function was normal. The estimated ejection fraction was in the range of 60% to 65%. Wall motion was normal; there were no regional wall motion abnormalities. Doppler parameters are consistent with abnormal left ventricular relaxation (grade 1 diastolic dysfunction).  ------------------------------------------------------------------- Aortic valve:  Mod /Severe Ca# Severe/Critical AS Valve area<0.5cm2.  Trileaflet; mildly thickened, mildly calcified leaflets. Mobility was not restricted.  Doppler:  Transvalvular velocity was within the normal range. There was no stenosis. There was mild regurgitation.    VTI ratio of LVOT to aortic valve: 0.21. Valve area (VTI): 0.52 cm^2. Indexed valve area (VTI): 0.31 cm^2/m^2. Peak velocity ratio of LVOT to aortic valve: 0.16. Valve area (Vmax): 0.42 cm^2. Indexed valve area (Vmax): 0.25 cm^2/m^2. Mean velocity ratio of LVOT to aortic valve: 0.21. Valve area (Vmean): 0.52 cm^2. Indexed valve area (Vmean): 0.32 cm^2/m^2. Mean gradient (S): 40 mm Hg. Peak gradient (S): 98 mm Hg.  ------------------------------------------------------------------- Aorta:  The aorta was normal, not dilated, and non-diseased. Aortic root: The aortic root was normal in size.  ------------------------------------------------------------------- Mitral valve:   Mildly thickened  leaflets . Mobility was not restricted.  Doppler:  Transvalvular velocity was within the normal range. There was no evidence for stenosis. There was moderate regurgitation.    Valve area by pressure half-time: 4.58 cm^2. Indexed valve area by pressure half-time: 2.77 cm^2/m^2.    Peak gradient (D): 6 mm Hg.  ------------------------------------------------------------------- Left atrium:  The atrium was normal in  size.  ------------------------------------------------------------------- Atrial septum:  Poorly visualized.  ------------------------------------------------------------------- Right ventricle:  The cavity size was normal. Wall thickness was normal. Systolic function was normal. Systolic pressure was within the normal range.  ------------------------------------------------------------------- Pulmonic valve:    Structurally normal valve.   Cusp separation was normal.  Doppler:  Transvalvular velocity was within the normal range. There was no evidence for stenosis. There was no regurgitation.  ------------------------------------------------------------------- Tricuspid valve:   Mildly thickened leaflets.  Doppler: Transvalvular velocity was within the normal range. There was trivial regurgitation.  ------------------------------------------------------------------- Pulmonary artery:   Poorly visualized. The main pulmonary artery was normal-sized. Systolic pressure was within the normal range.   ------------------------------------------------------------------- Right atrium:  The atrium was normal in size.  ------------------------------------------------------------------- Pericardium:  The pericardium was normal in appearance. There was no pericardial effusion.  ------------------------------------------------------------------- Systemic veins: Inferior vena cava: The vessel was normal in size.  ------------------------------------------------------------------- Post procedure conclusions Ascending Aorta:  - The aorta was normal, not dilated, and non-diseased.  ------------------------------------------------------------------- Measurements   Left ventricle                           Value          Reference  LV ID, ED, PLAX chordal                  48.2  mm       43 - 52  LV ID, ES, PLAX chordal                  34.6  mm       23 - 38  LV fx shortening,  PLAX chordal   (L)     28    %        >=29  LV PW thickness, ED                      11.7  mm       ----------  IVS/LV PW ratio, ED                      1.07           <=1.3  Stroke volume, 2D                        61    ml       ----------  Stroke volume/bsa, 2D                    37    ml/m^2   ----------    Ventricular septum                       Value          Reference  IVS thickness, ED                        12.5  mm       ----------    LVOT  Value          Reference  LVOT ID, S                               18    mm       ----------  LVOT area                                2.54  cm^2     ----------  LVOT peak velocity, S                    81.2  cm/s     ----------  LVOT mean velocity, S                    57.7  cm/s     ----------  LVOT VTI, S                              24    cm       ----------    Aortic valve                             Value          Reference  Aortic valve peak velocity, S            496   cm/s     ----------  Aortic valve mean velocity, S            280   cm/s     ----------  Aortic valve VTI, S                      117   cm       ----------  Aortic mean gradient, S                  40    mm Hg    ----------  Aortic peak gradient, S                  98    mm Hg    ----------  VTI ratio, LVOT/AV                       0.21           ----------  Aortic valve area, VTI                   0.52  cm^2     ----------  Aortic valve area/bsa, VTI               0.31  cm^2/m^2 ----------  Velocity ratio, peak, LVOT/AV            0.16           ----------  Aortic valve area, peak velocity         0.42  cm^2     ----------  Aortic valve area/bsa, peak              0.25  cm^2/m^2 ----------  velocity  Velocity ratio, mean, LVOT/AV            0.21           ----------  Aortic valve area, mean velocity         0.52  cm^2     ----------  Aortic valve area/bsa, mean              0.32  cm^2/m^2 ----------  velocity  Aortic regurg  pressure half-time         481   ms       ----------    Aorta                                    Value          Reference  Aortic root ID, ED                       36    mm       ----------    Left atrium                              Value          Reference  LA ID, A-P, ES                           49    mm       ----------  LA ID/bsa, A-P                   (H)     2.97  cm/m^2   <=2.2  LA volume, S                             94.5  ml       ----------  LA volume/bsa, S                         57.2  ml/m^2   ----------  LA volume, ES, 1-p A4C                   78.2  ml       ----------  LA volume/bsa, ES, 1-p A4C               47.3  ml/m^2   ----------  LA volume, ES, 1-p A2C                   114   ml       ----------  LA volume/bsa, ES, 1-p A2C               69    ml/m^2   ----------    Mitral valve                             Value          Reference  Mitral E-wave peak velocity              127   cm/s     ----------  Mitral A-wave peak velocity              135   cm/s     ----------  Mitral deceleration time                 165  ms       150 - 230  Mitral pressure half-time                48    ms       ----------  Mitral peak gradient, D                  6     mm Hg    ----------  Mitral E/A ratio, peak                   0.9            ----------  Mitral valve area, PHT, DP               4.58  cm^2     ----------  Mitral valve area/bsa, PHT, DP           2.77  cm^2/m^2 ----------  Mitral regurg VTI, PISA                  268   cm       ----------    Right atrium                             Value          Reference  RA ID, S-I, ES, A4C                      49    mm       34 - 49  RA area, ES, A4C                         12    cm^2     8.3 - 19.5  RA volume, ES, A/L                       25.9  ml       ----------  RA volume/bsa, ES, A/L                   15.7  ml/m^2   ----------    Systemic veins                           Value          Reference  Estimated CVP                             3     mm Hg    ----------    Right ventricle                          Value          Reference  RV ID, ED, PLAX                  (H)     39.7  mm       19 - 38  TAPSE                                    18.2  mm       ----------  RV s&', lateral, S  10.1  cm/s     ----------    Pulmonic valve                           Value          Reference  Pulmonic regurg velocity, ED             103   cm/s     ----------  Legend: (L)  and  (H)  mark values outside specified reference range.  ------------------------------------------------------------------- Prepared and Electronically Authenticated by  Dorothyann Peng, MD 2019-12-28T18:34:16   Physicians   Panel Physicians Referring Physician Case Authorizing Physician  Tonny Bollman, MD (Primary)    Procedures   RIGHT HEART CATH AND CORONARY ANGIOGRAPHY  Conclusion     Mid RCA lesion is 30% stenosed.  There is severe aortic valve stenosis.   1.  Calcified coronary arteries with minor nonobstructive CAD 2.  Severely calcified aortic valve biplane fluoroscopy with known critical aortic stenosis by noninvasive assessment 3.  Moderate mitral annular calcification biplane fluoroscopy  Recommendations: Continue multidisciplinary TAVR evaluation   Indications   Severe aortic stenosis [I35.0 (ICD-10-CM)]  Procedural Details   Technical Details INDICATION: Critical aortic stenosis with congestive heart failure. This is a very spry, independent 85 year old woman presenting for right and left heart catheterization in preparation for TAVR  PROCEDURAL DETAILS: There was an indwelling IV in a right antecubital vein. Using normal sterile technique, the IV was changed out for a 5 Fr brachial sheath over a 0.018 inch wire. The right wrist was then prepped, draped, and anesthetized with 1% lidocaine. Using the modified Seldinger technique a 5/6 French Slender sheath was placed in the right radial artery.  Intra-arterial verapamil was administered through the radial artery sheath. IV heparin was administered after a JR4 catheter was advanced into the central aorta. A Swan-Ganz catheter was used for the right heart catheterization. Standard protocol was followed for recording of right heart pressures and sampling of oxygen saturations. Fick cardiac output was calculated. Standard Judkins catheters were used for selective coronary angiography. There were no immediate procedural complications. The patient was transferred to the post catheterization recovery area for further monitoring.    Estimated blood loss <50 mL.   During this procedure medications were administered to achieve and maintain moderate conscious sedation while the patient's heart rate, blood pressure, and oxygen saturation were continuously monitored and I was present face-to-face 100% of this time.  Medications  (Filter: Administrations occurring from 07/25/18 1114 to 07/25/18 1230)  Medication Rate/Dose/Volume Action  Date Time   midazolam (VERSED) injection (mg) 1 mg Given 07/25/18 1134   Total dose as of 08/10/18 0920        1 mg        lidocaine (PF) (XYLOCAINE) 1 % injection (mL) 2 mL Given 07/25/18 1149   Total dose as of 08/10/18 0920 2 mL Given 1151   4 mL        Heparin (Porcine) in NaCl 1000-0.9 UT/500ML-% SOLN (mL) 500 mL Given 07/25/18 1159   Total dose as of 08/10/18 0920 500 mL Given 1159   1,500 mL 500 mL Given 1159   heparin injection (Units) 3,000 Units Given 07/25/18 1201   Total dose as of 08/10/18 0920        3,000 Units        iohexol (OMNIPAQUE) 350 MG/ML injection (mL) 30 mL Given 07/25/18 1212   Total dose as of 08/10/18 0920  30 mL        Radial Cocktail/Verapamil only (mL) 10 mL Given 07/25/18 1155   Total dose as of 08/10/18 0920        10 mL        Sedation Time   Sedation Time Physician-1: 35 minutes 36 seconds  Coronary Findings   Diagnostic  Dominance: Right  Left Main  The left  main is short. There is no stenosis present. The vessel divides into the LAD and left circumflex.  Left Anterior Descending  The vessel exhibits minimal luminal irregularities. The LAD is patent throughout. The vessel reaches the apex. There there is a single moderate sized diagonal without stenosis and a septal cascade without significant stenosis. The distal LAD has the appearance of a ' twin LAD' distribution  Left Circumflex  There is mild diffuse disease throughout the vessel. The circumflex is normal in caliber. There is calcification in the proximal circumflex without stenosis. There are diffuse irregularities but no significant stenoses. There are 2 obtuse marginal branches without stenosis.  First Obtuse Marginal Branch  There is mild disease in the vessel.  Second Obtuse Marginal Branch  There is mild disease in the vessel.  Right Coronary Artery  Mid RCA lesion 30% stenosed  Mid RCA lesion is 30% stenosed. The lesion is moderately calcified. The RCA is a normal caliber, dominant vessel. In the mid vessel there is 30 to 40% stenosis. There is no obstructive disease throughout the distribution of the RCA. The PDA and PLA branches are patent. The PDA branch is patent and the PLA branches are small.  Intervention   No interventions have been documented.  Left Heart   Mitral Valve The annulus is calcified.  Aortic Valve There is severe aortic valve stenosis. The aortic valve is calcified.  Coronary Diagrams   Diagnostic  Dominance: Right    Intervention   Implants    No implant documentation for this case.  Syngo Images   Show images for CARDIAC CATHETERIZATION  MERGE Images   Show images for CARDIAC CATHETERIZATION   Link to Procedure Log   Procedure Log    Hemo Data    Most Recent Value  Fick Cardiac Output 6.14 L/min  Fick Cardiac Output Index 3.73 (L/min)/BSA  RA A Wave 4 mmHg  RA V Wave 3 mmHg  RA Mean 2 mmHg  RV Systolic Pressure 36 mmHg  RV Diastolic  Pressure 1 mmHg  RV EDP 4 mmHg  PA Systolic Pressure 37 mmHg  PA Diastolic Pressure 8 mmHg  PA Mean 18 mmHg  PW A Wave 13 mmHg  PW V Wave 10 mmHg  PW Mean 9 mmHg  AO Systolic Pressure 116 mmHg  AO Diastolic Pressure 43 mmHg  AO Mean 69 mmHg  QP/QS 1  TPVR Index 4.83 HRUI  TSVR Index 18.49 HRUI  PVR SVR Ratio 0.13  TPVR/TSVR Ratio 0.26    ADDENDUM REPORT: 08/08/2018 11:36  CLINICAL DATA:  Aortic stenosis  EXAM: Cardiac TAVR CT  TECHNIQUE: The patient was scanned on a Siemens Force 192 slice scanner. A 120 kV retrospective scan was triggered in the descending thoracic aorta at 111 HU's. Gantry rotation speed was 270 msecs and collimation was .9 mm. No beta blockade or nitro were given. The 3D data set was reconstructed in 5% intervals of the R-R cycle. Systolic and diastolic phases were analyzed on a dedicated work station using MPR, MIP and VRT modes. The patient received 80 cc of contrast.  FINDINGS: Aortic Valve:  Tri leaflet severely calcified with restricted leaflet motion  Aorta: Non aneurysmal normal arch vessels. Severe calcification of the STJ. Nodular calcification at the base of the non and right cusps  Sinotubular Junction: 26 mm > 75% circumferential calcification  Ascending Thoracic Aorta: 32 mm  Aortic Arch: 28 mm  Descending Thoracic Aorta: 22 mm  Sinus of Valsalva Measurements:  Non-coronary: 31 mm  Right - coronary: 29.6 mm  Left - coronary: 29.8 mm  Coronary Artery Height above Annulus:  Left Main: 10.2 mm above annulus  Right Coronary: 18.8 mm above annulus  Virtual Basal Annulus Measurements:  Maximum/Minimum Diameter: 28.1 mm x 19.9 mm  Perimeter: 77 mm  Area: 445 mm2  Coronary Arteries: Sufficient height above annulus for deployment  Optimum Fluoroscopic Angle for Delivery: LAO 14 Caudal 14 degrees  IMPRESSION: 1. Tri- leaflet AV with annular area of 445 mm2 suitable for a 26 mm Sapien 3  valve  2. Optimum angiographic angle for deployment LAO 14 Caudal 14 degrees  3.  Coronary arteries sufficient height above annulus for deployment  4. Normal aortic root 3.2 cm with > 75% circumferential calcification of the STJ and bulky nodular calcification at the base of the non and right coronary cusps  Charlton Haws   Electronically Signed   By: Charlton Haws M.D.   On: 08/08/2018 11:36   CLINICAL DATA:  85 year old female with history of severe aortic stenosis. Preprocedural study prior to potential transcatheter aortic valve replacement (TAVR).  EXAM: CT ANGIOGRAPHY CHEST, ABDOMEN AND PELVIS  TECHNIQUE: Multidetector CT imaging through the chest, abdomen and pelvis was performed using the standard protocol during bolus administration of intravenous contrast. Multiplanar reconstructed images and MIPs were obtained and reviewed to evaluate the vascular anatomy.  CONTRAST:  ISOVUE-370 IOPAMIDOL (ISOVUE-370) INJECTION 76%  COMPARISON:  No priors.  FINDINGS: CTA CHEST FINDINGS  Cardiovascular: Heart size is normal. There is no significant pericardial fluid, thickening or pericardial calcification. There is aortic atherosclerosis, as well as atherosclerosis of the great vessels of the mediastinum and the coronary arteries, including calcified atherosclerotic plaque in the left main, left anterior descending, left circumflex and right coronary arteries. Severe thickening and calcification of the aortic valve. Calcifications of the mitral annulus.  Mediastinum/Lymph Nodes: No pathologically enlarged mediastinal or hilar lymph nodes. Esophagus is unremarkable in appearance. No axillary lymphadenopathy.  Lungs/Pleura: Lung apices are incompletely imaged. With this limitation in mind, there are no definite suspicious appearing pulmonary nodules or masses noted. No acute consolidative airspace disease. No pleural effusions. Scattered areas of  mild peripheral predominant septal thickening, most evident throughout the mid to lower lungs. Scattered areas of very mild cylindrical bronchiectasis.  Musculoskeletal/Soft Tissues: There are no aggressive appearing lytic or blastic lesions noted in the visualized portions of the skeleton.  CTA ABDOMEN AND PELVIS FINDINGS  Hepatobiliary: Small calcified granuloma in the liver. No suspicious cystic or solid hepatic lesions. No intra or extrahepatic biliary ductal dilatation. Gallbladder is normal in appearance.  Pancreas: No pancreatic mass. No pancreatic ductal dilatation. No pancreatic or peripancreatic fluid or inflammatory changes.  Spleen: Unremarkable.  Adrenals/Urinary Tract: Low-attenuation lesions in both kidneys, the largest of which is in the interpolar region of the right kidney measuring 1.8 cm in diameter. Mild cortical thinning in both kidneys. No suspicious renal lesions. Bilateral adrenal glands are normal in appearance. No hydroureteronephrosis. Urinary bladder is normal in appearance.  Stomach/Bowel: Normal appearance of the stomach. No pathologic dilatation of small bowel or colon. Numerous colonic diverticulae are  noted, without surrounding inflammatory changes to suggest an acute diverticulitis at this time. Normal appendix.  Vascular/Lymphatic: Aortic atherosclerosis, without evidence of aneurysm or dissection in the abdominal or pelvic vasculature. Vascular findings and measurements pertinent to potential TAVR procedure, as detailed below. No lymphadenopathy noted in the abdomen or pelvis.  Reproductive: Uterus and ovaries are atrophic.  Other: No significant volume of ascites.  No pneumoperitoneum.  Musculoskeletal: There are no aggressive appearing lytic or blastic lesions noted in the visualized portions of the skeleton.  VASCULAR MEASUREMENTS PERTINENT TO TAVR:  AORTA:  Minimal Aortic Diameter-9 x 9 mm  Severity of Aortic  Calcification-severe  RIGHT PELVIS:  Right Common Iliac Artery -  Minimal Diameter-6.6 x 6.4 mm  Tortuosity-mild  Calcification-moderate to severe  Right External Iliac Artery -  Minimal Diameter-5.8 x 6.0 mm  Tortuosity - mild  Calcification-none  Right Common Femoral Artery -  Minimal Diameter-6.2 x 5.2 mm  Tortuosity - mild  Calcification-mild  LEFT PELVIS:  Left Common Iliac Artery -  Minimal Diameter-7.8 x 7.0 mm  Tortuosity - mild  Calcification-moderate to severe  Left External Iliac Artery -  Minimal Diameter-6.1 x 6.3 mm  Tortuosity - mild  Calcification-none  Left Common Femoral Artery -  Minimal Diameter-6.6 x 5.8 mm  Tortuosity - mild  Calcification-mild  Review of the MIP images confirms the above findings.  IMPRESSION: 1. Vascular findings and measurements pertinent to potential TAVR procedure, as detailed above. 2. Severe thickening calcification of the aortic valve, compatible with the reported clinical history of severe aortic stenosis. 3. Aortic atherosclerosis, in addition to left main and 3 vessel coronary artery disease. 4. Changes in the lungs which may suggest developing interstitial lung disease. Outpatient referral to Pulmonology for further evaluation should be considered if clinically appropriate. 5. Colonic diverticulosis without findings of acute diverticulitis at this time. 6. Additional incidental findings, as above.   Electronically Signed   By: Trudie Reed M.D.   On: 08/08/2018 11:56    [facility] Adult Cardiac Surgery Database Version 2.9 RISK SCORES Procedure: Isolated AVR CALCULATE  Risk of Mortality:  4.443%   Renal Failure:  2.036%   Permanent Stroke:  3.165%   Prolonged Ventilation:  9.195%   DSW Infection:  0.045%   Reoperation:  3.510%   Morbidity or Mortality:  14.967%   Short Length of Stay:  19.588%   Long Length of Stay:  7.609%    Impression:  This  85 year old woman has stage D, severe, symptomatic aortic stenosis with recent hospitalization for acute diastolic congestive heart failure with pulmonary edema.  She improved with diuresis and now has New York Heart Association class II symptoms of exertional shortness of breath.  She has not been very active since her admission in December.  I have personally reviewed her 2D echocardiogram, cardiac catheterization, and CTA studies.  Her echocardiogram shows a trileaflet aortic valve with heavily calcified leaflets and restricted mobility.  The mean gradient is 40 mmHg with a peak gradient of 98 mmHg consistent with severe aortic stenosis.  Left ventricular systolic function is normal.  Cardiac catheterization shows calcified coronary arteries with minor nonobstructive coronary disease.  I agree that aortic valve replacement is indicated in this patient to prevent recurrent episodes of acute diastolic congestive heart failure and progressive left ventricular deterioration.  I think transcatheter aortic valve replacement would be the best option for her given her advanced age.  Her gated cardiac CTA shows anatomy suitable for transcatheter aortic valve replacement using a 26  mm Sapien 3 valve.  There is greater than 75% circumferential calcification of the sinotubular junction which was measured at 26 mm in diameter on the CTA report and on our remeasurement was 28 mm.  She also has bulky nodular calcification of the base of the non-and right coronary cusps which may increase the risk of perivalvular leak but I do not think are prohibitive.  Her abdominal and pelvic CTA shows adequate pelvic vascular anatomy to allow transfemoral insertion.  The patient and her daughter and son-in-law were counseled at length regarding treatment alternatives for management of severe symptomatic aortic stenosis. The risks and benefits of surgical intervention has been discussed in detail. Long-term prognosis with medical therapy  was discussed. Alternative approaches such as conventional surgical aortic valve replacement, transcatheter aortic valve replacement, and palliative medical therapy were compared and contrasted at length. This discussion was placed in the context of the patient's own specific clinical presentation and past medical history. All of their questions have been addressed.   Following the decision to proceed with transcatheter aortic valve replacement, a discussion was held regarding what types of management strategies would be attempted intraoperatively in the event of life-threatening complications, including whether or not the patient would be considered a candidate for the use of cardiopulmonary bypass and/or conversion to open sternotomy for attempted surgical intervention. The patient is aware of the fact that transient use of cardiopulmonary bypass may be necessary.  Given her age of 36 years I do not think she would be a candidate for emergent median sternotomy for any intraoperative complications.  The patient has been advised of a variety of complications that might develop including but not limited to risks of death, stroke, paravalvular leak, aortic dissection or other major vascular complications, aortic annulus rupture, device embolization, cardiac rupture or perforation, mitral regurgitation, acute myocardial infarction, arrhythmia, heart block or bradycardia requiring permanent pacemaker placement, congestive heart failure, respiratory failure, renal failure, pneumonia, infection, other late complications related to structural valve deterioration or migration, or other complications that might ultimately cause a temporary or permanent loss of functional independence or other long term morbidity. The patient provides full informed consent for the procedure as described and all questions were answered.    Plan:  Transfemoral transcatheter aortic valve replacement on Tuesday, 08/15/2018.  I spent 60  minutes performing this consultation and > 50% of this time was spent face to face counseling and coordinating the care of this patient's severe symptomatic aortic stenosis.   [name], MD 08/09/2018

## 2018-08-10 NOTE — Therapy (Signed)
[facility] [address] [place], [place], [address] Phone: [phone]   Fax:  [phone]  Physical Therapy Evaluation  Patient Details  Name: Natalie Sherman MRN: [id number] Date of Birth: [birth year] Referring Provider (PT): Dr Tonny Bollman    Encounter Date: 08/09/2018  PT End of Session - 08/09/18 1630    Visit Number  1    Number of Visits  1    Date for PT Re-Evaluation  08/09/18    Authorization Type  Medicare     PT Start Time  1330    PT Stop Time  1403    PT Time Calculation (min)  33 min    Activity Tolerance  Patient tolerated treatment well    Behavior During Therapy  [facility] for tasks assessed/performed       Past Medical History:  Diagnosis Date  . Chronic diastolic CHF (congestive heart failure) (HCC)    a. EF of 60 to 65%, moderate LVH, no regional wall motion abnormalities, grade 1 diastolic dysfunction, severe aortic stenosis with a valve area less than 0.5 cm, mild aortic insufficiency, moderate mitral regurgitation  . CKD (chronic kidney disease) stage 3, GFR 30-59 ml/min (HCC)   . Hyperlipidemia   . Hypertension   . Severe aortic stenosis     Past Surgical History:  Procedure Laterality Date  . CARDIAC CATHETERIZATION  07/25/2018  . DILATION AND CURETTAGE OF UTERUS  1968  . RIGHT HEART CATH AND CORONARY ANGIOGRAPHY N/A 07/25/2018   Procedure: RIGHT HEART CATH AND CORONARY ANGIOGRAPHY;  Surgeon: Tonny Bollman, MD;  Location: [place] INVASIVE CV LAB;  Service: Cardiovascular;  Laterality: N/A;    There were no vitals filed for this visit.   Subjective Assessment - 08/09/18 1321    Subjective  Patient reports on 12/27/ she woke up short of breath. Since that point she reports she has not been very short of breath. She has baseline back pain.     Currently in Pain?  No/denies   Not haveing any back pain right now but is using a wearable tens unit.         [facility] PT Assessment - 08/10/18 0001      Assessment   Medical Diagnosis  Severe Aortic Stenosis     Referring Provider (PT)  Dr Tonny Bollman     Onset Date/Surgical Date  06/16/18    Hand Dominance  Right    Next MD Visit  today     Prior Therapy  None      Precautions   Precautions  None      Restrictions   Weight Bearing Restrictions  No      Balance Screen   Has the patient fallen in the past 6 months  No    Has the patient had a decrease in activity level because of a fear of falling?   No    Is the patient reluctant to leave their home because of a fear of falling?   No      Home Environment   Additional Comments  3 steps into the house      Prior Function   Level of Independence  Independent    Vocation  Retired      IT consultant   Overall Cognitive Status  Within Functional Limits for tasks assessed    Attention  Focused    Focused Attention  Appears intact    Memory  Appears intact    Awareness  Appears intact  Problem Solving  Appears intact      Observation/Other Assessments   Focus on Therapeutic Outcomes (FOTO)   1x visit       Sensation   Light Touch  Appears Intact    Additional Comments  no feeling in the ends of her fingers       Coordination   Gross Motor Movements are Fluid and Coordinated  Yes    Fine Motor Movements are Fluid and Coordinated  Yes      AROM   Overall AROM Comments  Full UE/LE AROM       Strength   Overall Strength Comments  5/5 gross UE /LE strength     Right Hand Grip (lbs)  30    Left Hand Grip (lbs)  20      Ambulation/Gait   Gait Comments  walks with flexed posture       6 Minute Walk- Baseline   6 Minute Walk- Baseline  yes    BP (mmHg)  134/54    HR (bpm)  58    02 Sat (%RA)  96 %    Modified Borg Scale for Dyspnea  0- Nothing at all    Perceived Rate of Exertion (Borg)  6-      6 Minute walk- Post Test   6 Minute Walk Post Test  yes    HR BPM BP (mmHg)  78 152/69    02 Sat (%RA)  95 %    Modified Borg Scale for Dyspnea  2- Mild shortness of  breath    Perceived Rate of Exertion (Borg)  13- Somewhat hard      6 minute walk test results    Aerobic Endurance Distance Walked  345    Endurance additional comments  76% distability 3 seated rest breaks required        [facility] Pre-Surgical Assessment - 08/10/18 0001    5 Meter Walk Test- trial 1  7 sec    5 Meter Walk Test- trial 2  8 sec.     5 Meter Walk Test- trial 3  9 sec.    5 meter walk test average  8 sec    4 Stage Balance Test tolerated for:   10 sec.    Sit To Stand Test- trial 1  17 sec.    ADL/IADL Independent with:  Bathing;Dressing    ADL/IADL Needs Assistance with:  Meal prep;Finances;Yard work    HR (bpm)  78              Objective measurements completed on examination: See above findings.                           Plan - 08/10/18 8675    Clinical Impression Statement  See belwo     Clinical Presentation  Stable    Clinical Decision Making  Low    PT Frequency  One time visit      Clinical Impression Statement: Pt is a 85 yo female presenting to OP PT for evaluation prior to possible TAVR surgery due to severe aortic stenosis. Pt reports onset of dyspnea approximately 3 months months ago. Symptoms are limiting ability to ambulate. Pt presents with decreased ROM and strength, decreased balance and is at high fall risk 4 stage balance test, decreased walking speed and decreased aerobic endurance per 6 minute walk test. Pt ambulated 115 feet in 57 se before requesting a seated rest beak lasting 1:03.. The  patient stood and walked another 115 feet in 55 sec. She then required a seated rest break of 1:35. She stood and walked another 115 in 1:13.At tme of rest, patient's HR 78 bpm and O2 was 95% on room air Pt reported 2/10 shortness of breath on modified scale for dyspnea. Pt ambulated a total of 345 feet in 6 minute walk. B/P increased significantly with 6 minute walk test. Based on the Short Physical Performance Battery, patient has a  frailty rating of 8/12 with </= 5/12 considered frail.   Patient will benefit from skilled therapeutic intervention in order to improve the following deficits and impairments:  Abnormal gait, Difficulty walking  Visit Diagnosis: Other abnormalities of gait and mobility     Problem List Patient Active Problem List   Diagnosis Date Noted  . Hyperlipidemia   . CKD (chronic kidney disease) stage 3, GFR 30-59 ml/min (HCC)   . Severe aortic stenosis 07/25/2018  . Chronic diastolic heart failure (HCC) 06/30/2018  . HTN (hypertension) 06/30/2018    Dessie Coma PT DPT  08/10/2018, 9:47 AM  [facility] [address] [place], [place], [address] Phone: [phone]   Fax:  [phone]  Name: ALLIEMAE DENGLER MRN: [id number] Date of Birth: 1921/07/15

## 2018-08-11 ENCOUNTER — Encounter (HOSPITAL_COMMUNITY)
Admission: RE | Admit: 2018-08-11 | Discharge: 2018-08-11 | Disposition: A | Discharge status: Home / Self Care | Payer: Medicare Other | Source: Ambulatory Visit | Attending: Cardiovascular Disease | Admitting: Cardiovascular Disease

## 2018-08-11 ENCOUNTER — Encounter (HOSPITAL_COMMUNITY): Payer: Self-pay

## 2018-08-11 DIAGNOSIS — I517 Cardiomegaly: Secondary | ICD-10-CM | POA: Insufficient documentation

## 2018-08-11 DIAGNOSIS — Z01818 Encounter for other preprocedural examination: Secondary | ICD-10-CM | POA: Diagnosis not present

## 2018-08-11 DIAGNOSIS — Z952 Presence of prosthetic heart valve: Secondary | ICD-10-CM | POA: Diagnosis not present

## 2018-08-11 DIAGNOSIS — I38 Endocarditis, valve unspecified: Secondary | ICD-10-CM | POA: Diagnosis not present

## 2018-08-11 DIAGNOSIS — J8410 Pulmonary fibrosis, unspecified: Secondary | ICD-10-CM | POA: Diagnosis not present

## 2018-08-11 DIAGNOSIS — J90 Pleural effusion, not elsewhere classified: Secondary | ICD-10-CM | POA: Insufficient documentation

## 2018-08-11 DIAGNOSIS — I350 Nonrheumatic aortic (valve) stenosis: Secondary | ICD-10-CM

## 2018-08-11 HISTORY — DX: Cardiac murmur, unspecified: R01.1

## 2018-08-11 HISTORY — DX: Unspecified osteoarthritis, unspecified site: M19.90

## 2018-08-11 HISTORY — DX: Scoliosis, unspecified: M41.9

## 2018-08-11 HISTORY — DX: Personal history of other medical treatment: Z92.89

## 2018-08-11 LAB — TYPE AND SCREEN
ABO/RH(D): A POS
Antibody Screen: NEGATIVE

## 2018-08-11 LAB — CBC
HCT: 35.2 % — ABNORMAL LOW (ref 36.0–46.0)
Hemoglobin: 10.9 g/dL — ABNORMAL LOW (ref 12.0–15.0)
MCH: 29.6 pg (ref 26.0–34.0)
MCHC: 31 g/dL (ref 30.0–36.0)
MCV: 95.7 fL (ref 80.0–100.0)
Platelets: 180 10*3/uL (ref 150–400)
RBC: 3.68 MIL/uL — ABNORMAL LOW (ref 3.87–5.11)
RDW: 14.3 % (ref 11.5–15.5)
WBC: 6.3 10*3/uL (ref 4.0–10.5)
nRBC: 0 % (ref 0.0–0.2)

## 2018-08-11 LAB — COMPREHENSIVE METABOLIC PANEL
ALT: 18 U/L (ref 0–44)
AST: 26 U/L (ref 15–41)
Albumin: 4.1 g/dL (ref 3.5–5.0)
Alkaline Phosphatase: 40 U/L (ref 38–126)
Anion gap: 11 (ref 5–15)
BUN: 26 mg/dL — ABNORMAL HIGH (ref 8–23)
CO2: 20 mmol/L — ABNORMAL LOW (ref 22–32)
CREATININE: 1.02 mg/dL — AB (ref 0.44–1.00)
Calcium: 9.5 mg/dL (ref 8.9–10.3)
Chloride: 108 mmol/L (ref 98–111)
GFR calc non Af Amer: 47 mL/min — ABNORMAL LOW (ref >60)
GFR, EST AFRICAN AMERICAN: 54 mL/min — AB (ref >60)
Glucose, Bld: 109 mg/dL — ABNORMAL HIGH (ref 70–99)
Potassium: 4.4 mmol/L (ref 3.5–5.1)
Sodium: 139 mmol/L (ref 135–145)
Total Bilirubin: 0.6 mg/dL (ref 0.3–1.2)
Total Protein: 7 g/dL (ref 6.5–8.1)

## 2018-08-11 LAB — BLOOD GAS, ARTERIAL
Acid-Base Excess: 0.8 mmol/L (ref 0.0–2.0)
Bicarbonate: 24.5 mmol/L (ref 20.0–28.0)
Drawn by: 470591
FIO2: 21
O2 Saturation: 97.8 %
PH ART: 7.443 (ref 7.350–7.450)
Patient temperature: 98.6
pCO2 arterial: 36.5 mmHg (ref 32.0–48.0)
pO2, Arterial: 94.8 mmHg (ref 83.0–108.0)

## 2018-08-11 LAB — ABO/RH: ABO/RH(D): A POS

## 2018-08-11 LAB — URINALYSIS, ROUTINE W REFLEX MICROSCOPIC
BILIRUBIN URINE: NEGATIVE
Glucose, UA: NEGATIVE mg/dL
Hgb urine dipstick: NEGATIVE
Ketones, ur: NEGATIVE mg/dL
Leukocytes,Ua: NEGATIVE
Nitrite: NEGATIVE
Protein, ur: NEGATIVE mg/dL
Specific Gravity, Urine: 1.021 (ref 1.005–1.030)
pH: 6 (ref 5.0–8.0)

## 2018-08-11 LAB — PROTIME-INR
INR: 1.01
Prothrombin Time: 13.2 seconds (ref 11.4–15.2)

## 2018-08-11 LAB — HEMOGLOBIN A1C
Hgb A1c MFr Bld: 5.7 % — ABNORMAL HIGH (ref 4.8–5.6)
Mean Plasma Glucose: 116.89 mg/dL

## 2018-08-11 LAB — SURGICAL PCR SCREEN
MRSA, PCR: NEGATIVE
Staphylococcus aureus: NEGATIVE

## 2018-08-11 LAB — APTT: aPTT: 25 seconds (ref 24–36)

## 2018-08-11 LAB — BRAIN NATRIURETIC PEPTIDE: B Natriuretic Peptide: 867.6 pg/mL — ABNORMAL HIGH (ref 0.0–100.0)

## 2018-08-11 NOTE — Progress Notes (Signed)
PCP - Dr Lupita Raider  Cardiologist - Dr. Excell Seltzer Chest x-ray -    EKG - 08/11/2018 Stress Test -   ECHO - 05/2018  Cardiac Cath - 07/25/2018  Sleep Study - no CPAP - no  LABS-  ASA- 81mg - take until am of OR  HA1C- Fasting Blood Sugar - 0 Checks Blood Sugar _0___ times a day  Anesthesia-  Pt denies having chest pain, sob, or fever at this time. All instructions explained to the pt, with a verbal understanding of the material. Pt agrees to go over the instructions while at home for a better understanding. The opportunity to ask questions was provided.

## 2018-08-11 NOTE — Pre-Procedure Instructions (Addendum)
Natalie Sherman  08/11/2018     Your procedure is scheduled on Tuesday, February 25, [place] Entrance 'A', register in the Admitting office at 11:30 AM   Call this number if you have problems the morning of surgery: [phone]  This is the number for the Pre- Surgical Desk.     Remember:  Do not eat or drink after midnight Monday, February 24.   Take these medicines the morning of surgery with A SIP OF WATER: None Aspirin instructions per your Dr. 1 Week prior to surgery STOP taking  Aspirin Products (Goody Powder, Excedrin Migraine), Ibuprofen (Advil), Naproxen (Aleve), Vitamins and Herbal Products (ie Fish Oil).   Special instructions:   [facility]- Preparing For Surgery  Before surgery, you can play an important role. Because skin is not sterile, your skin needs to be as free of germs as possible. You can reduce the number of germs on your skin by washing with CHG (chlorahexidine gluconate) Soap before surgery.  CHG is an antiseptic cleaner which kills germs and bonds with the skin to continue killing germs even after washing.    Oral Hygiene is also important to reduce your risk of infection.  Remember - BRUSH YOUR TEETH THE MORNING OF SURGERY WITH YOUR REGULAR TOOTHPASTE  Please do not use if you have an allergy to CHG or antibacterial soaps. If your skin becomes reddened/irritated stop using the CHG.  Do not shave (including legs and underarms) for at least 48 hours prior to first CHG shower. It is OK to shave your face.  Please follow these instructions carefully.   1. Shower the NIGHT BEFORE SURGERY and the MORNING OF SURGERY with CHG.   2. If you chose to wash your hair, wash your hair first as usual with your normal shampoo. After you shampoo,wash your face and private area with the soap you use at home, then rinse your hair and body thoroughly to remove the shampoo and soap.  3. Use CHG as you would any other liquid soap. You can apply CHG directly to the skin and  wash gently with a scrungie or a clean washcloth.   4. Apply the CHG Soap to your body ONLY FROM THE NECK DOWN.  Do not use on open wounds or open sores. Avoid contact with your eyes, ears, mouth and genitals (private parts).   5. Wash thoroughly, paying special attention to the area where your surgery will be performed.  6. Thoroughly rinse your body with warm water from the neck down.  7. DO NOT shower/wash with your normal soap after using and rinsing off the CHG Soap.  8. Pat yourself dry with a CLEAN TOWEL.  9. Wear CLEAN PAJAMAS to bed the night before surgery, wear comfortable clothes the morning of surgery  10. Place CLEAN SHEETS on your bed the night of your first shower and DO NOT SLEEP WITH PETS.  Day of Surgery: Shower as written above  Do not apply any deodorants/lotions.  Please wear clean clothes to the hospital/surgery center.   Remember to brush your teeth WITH YOUR REGULAR TOOTHPASTE.  Do not wear jewelry, make-up or nail polish.  Do not wear  powders, or perfumes.  Do not shave 48 hours prior to surgery.  Men may shave face and neck.  Do not bring valuables to the hospital.  [facility] is not responsible for any belongings or valuables.  Contacts, dentures or bridgework may not be worn into surgery.  Leave your suitcase in  the car.  After surgery it may be brought to your room.  For patients admitted to the hospital, discharge time will be determined by your treatment team.  Patients discharged the day of surgery will not be allowed to drive home.   Please read over the following fact sheets that you were given: Pain Booklet, Patient Instructions for Mupirocin Application, Coughing and Deep Breathing, Surgical Site Infections.

## 2018-08-14 MED ORDER — SODIUM CHLORIDE 0.9 % IV SOLN
1.5000 g | INTRAVENOUS | Status: AC
  Administered 2018-08-15: 1.5 g via INTRAVENOUS
  Filled 2018-08-14: qty 1.5

## 2018-08-14 MED ORDER — VANCOMYCIN HCL 10 G IV SOLR
1250.0000 mg | INTRAVENOUS | Status: AC
  Administered 2018-08-15: 1250 mg via INTRAVENOUS
  Filled 2018-08-14: qty 1250

## 2018-08-14 MED ORDER — DOPAMINE-DEXTROSE 3.2-5 MG/ML-% IV SOLN
0.0000 ug/kg/min | INTRAVENOUS | Status: DC
  Filled 2018-08-14: qty 250

## 2018-08-14 MED ORDER — NITROGLYCERIN IN D5W 200-5 MCG/ML-% IV SOLN
2.0000 ug/min | INTRAVENOUS | Status: DC
  Filled 2018-08-14: qty 250

## 2018-08-14 MED ORDER — EPINEPHRINE PF 1 MG/ML IJ SOLN
0.0000 ug/min | INTRAVENOUS | Status: DC
  Filled 2018-08-14: qty 4

## 2018-08-14 MED ORDER — PHENYLEPHRINE HCL-NACL 20-0.9 MG/250ML-% IV SOLN
30.0000 ug/min | INTRAVENOUS | Status: DC
  Filled 2018-08-14: qty 250

## 2018-08-14 MED ORDER — MAGNESIUM SULFATE 50 % IJ SOLN
40.0000 meq | INTRAMUSCULAR | Status: DC
  Filled 2018-08-14: qty 9.85

## 2018-08-14 MED ORDER — POTASSIUM CHLORIDE 2 MEQ/ML IV SOLN
80.0000 meq | INTRAVENOUS | Status: DC
  Filled 2018-08-14: qty 40

## 2018-08-14 MED ORDER — DEXMEDETOMIDINE HCL IN NACL 400 MCG/100ML IV SOLN
0.1000 ug/kg/h | INTRAVENOUS | Status: AC
  Administered 2018-08-15: 6 ug/kg/h via INTRAVENOUS
  Filled 2018-08-14: qty 100

## 2018-08-14 MED ORDER — INSULIN REGULAR(HUMAN) IN NACL 100-0.9 UT/100ML-% IV SOLN
INTRAVENOUS | Status: DC
  Filled 2018-08-14: qty 100

## 2018-08-14 MED ORDER — SODIUM CHLORIDE 0.9 % IV SOLN
INTRAVENOUS | Status: DC
  Filled 2018-08-14: qty 30

## 2018-08-14 MED ORDER — NOREPINEPHRINE BITARTRATE 1 MG/ML IV SOLN
0.0000 ug/min | INTRAVENOUS | Status: AC
  Administered 2018-08-15: 1 ug/min via INTRAVENOUS
  Filled 2018-08-14: qty 4

## 2018-08-14 NOTE — H&P (Signed)
[address]       [place] [address]             [phone]      Cardiothoracic Surgery Admission History and Physical   Referring Provider is Iran Ouch, MD PCP is Lupita Raider, MD      Chief Complaint  Patient presents with  . Aortic Stenosis        HPI:  The patient is a 85 year old active and functional woman with history of hypertension, hyperlipidemia, stage III chronic kidney disease, aortic stenosis, and chronic diastolic congestive heart failure.  She said that she was in her usual state of health with minimal symptoms until she woke up in the morning on 06/17/2019 and felt like she could not breathe.  She was taken to the emergency room at [facility] by her family and diagnosed with pulmonary edema and acute congestive heart failure.  She improved with diuresis.  An echocardiogram on 06/17/2018 showed a trileaflet aortic valve with mild calcification and thickening of the leaflets.  The mean gradient across the valve was measured at 40 mmHg with a peak gradient of 98 mmHg.  The dimensionless index was 0.16 and the valve area was measured at 0.52 cm.  Left ventricular ejection fraction was 65%.  There was moderate mitral regurgitation.  Since discharge on 06/20/2018 she said that she is felt fairly well.  She has been doing some exercises sitting in a chair.  She was riding a stationary bicycle prior to her hospitalization but has not gotten back to that yet.  She was previously driving a car but has not been driving since her diagnosis of severe aortic stenosis.  She feels like her energy level is good although she is not very active at this time.  She denies any chest pain or pressure.  She has had no dizziness or syncope.  She denies orthopnea and PND.  She reports a history of a heart murmur as a child and was told that she had rheumatic fever.  She never had an echo until her recent admission.  She came to the office with her daughter and  son-in-law.  She has been widowed for over 30 years and has lived alone.  She has 2 daughters who live in the area.  She also has a niece who is a retired Estate agent who comes by to check on her every day.      Past Medical History:  Diagnosis Date  . Chronic diastolic CHF (congestive heart failure) (HCC)    a. EF of 60 to 65%, moderate LVH, no regional wall motion abnormalities, grade 1 diastolic dysfunction, severe aortic stenosis with a valve area less than 0.5 cm, mild aortic insufficiency, moderate mitral regurgitation  . CKD (chronic kidney disease) stage 3, GFR 30-59 ml/min (HCC)   . Hyperlipidemia   . Hypertension   . Severe aortic stenosis          Past Surgical History:  Procedure Laterality Date  . CARDIAC CATHETERIZATION  07/25/2018  . DILATION AND CURETTAGE OF UTERUS  1968  . RIGHT HEART CATH AND CORONARY ANGIOGRAPHY N/A 07/25/2018   Procedure: RIGHT HEART CATH AND CORONARY ANGIOGRAPHY;  Surgeon: Tonny Bollman, MD;  Location: [facility] INVASIVE CV LAB;  Service: Cardiovascular;  Laterality: N/A;         Family History  Problem Relation Age of Onset  . Diabetes Mellitus II Mother   . Heart disease Father   . COPD  Father     Social History        Socioeconomic History  . Marital status: Widowed    Spouse name: Not on file  . Number of children: Not on file  . Years of education: Not on file  . Highest education level: Not on file  Occupational History  . Occupation: retired  Engineer, production  . Financial resource strain: Not on file  . Food insecurity:    Worry: Not on file    Inability: Not on file  . Transportation needs:    Medical: Not on file    Non-medical: Not on file  Tobacco Use  . Smoking status: Never Smoker  . Smokeless tobacco: Never Used  Substance and Sexual Activity  . Alcohol use: Never    Frequency: Never  . Drug use: Never  . Sexual activity: Not Currently  Lifestyle  . Physical activity:    Days per  week: Not on file    Minutes per session: Not on file  . Stress: Not on file  Relationships  . Social connections:    Talks on phone: Not on file    Gets together: Not on file    Attends religious service: Not on file    Active member of club or organization: Not on file    Attends meetings of clubs or organizations: Not on file    Relationship status: Not on file  . Intimate partner violence:    Fear of current or ex partner: Not on file    Emotionally abused: Not on file    Physically abused: Not on file    Forced sexual activity: Not on file  Other Topics Concern  . Not on file  Social History Narrative  . Not on file          Current Outpatient Medications  Medication Sig Dispense Refill  . Ascorbic Acid (VITAMIN C) 1000 MG tablet Take 2,000 mg by mouth daily.    Marland Kitchen aspirin 81 MG EC tablet Take 1 tablet (81 mg total) by mouth daily. 30 tablet 2  . atenolol (TENORMIN) 50 MG tablet Take 1 tablet (50 mg total) by mouth daily. (Patient taking differently: Take 50 mg by mouth every evening. ) 90 tablet 0  . CALCIUM PO Take 2 tablets by mouth daily.    . cholecalciferol (VITAMIN D3) 25 MCG (1000 UT) tablet Take 2,000 Units by mouth 2 (two) times daily.     . furosemide (LASIX) 20 MG tablet Take 1 tablet (20 mg) by mouth once every other day (Patient taking differently: Take 20 mg by mouth every other day. IN THE MORNING.) 45 tablet 1  . Multiple Vitamin (MULTIVITAMIN WITH MINERALS) TABS tablet Take 1 tablet by mouth daily. Centrum Silver    . Multiple Vitamins-Minerals (PRESERVISION AREDS 2 PO) Take 1 tablet by mouth 2 (two) times daily.    . Omega-3 Fatty Acids (FISH OIL) 1000 MG CAPS Take 2,000 mg by mouth daily.     . Polyvinyl Alcohol-Povidone (REFRESH OP) Place 1 drop into both eyes 2 (two) times daily.    . potassium chloride (K-DUR) 10 MEQ tablet Take 1 tablet (10 meq) by mouth every other day (Patient taking differently: Take 10 mEq by  mouth every other day. IN THE MORNING) 45 tablet 1  . simvastatin (ZOCOR) 20 MG tablet Take 1 tablet (20 mg total) by mouth daily. (Patient taking differently: Take 20 mg by mouth every evening. ) 90 tablet 0   No current  facility-administered medications for this visit.          Allergies  Allergen Reactions  . Penicillins Rash    DID THE REACTION INVOLVE: Swelling of the face/tongue/throat, SOB, or low BP? No Sudden or severe rash/hives, skin peeling, or the inside of the mouth or nose? No Did it require medical treatment? No When did it last happen?more than 10 years ago If all above answers are "NO", may proceed with cephalosporin use.       Review of Systems:              General:                      normal appetite, decreased energy, no weight gain, no weight loss, no fever             Cardiac:                       no chest pain with exertion, no chest pain at rest, no SOB with exertion, no resting SOB, no PND, no orthopnea, no palpitations, no arrhythmia, no atrial fibrillation, + mild LE edema, no dizzy spells, no syncope             Respiratory:                 no shortness of breath, no home oxygen, no productive cough, no dry cough, no bronchitis, no wheezing, no hemoptysis, no asthma, no pain with inspiration or cough, no sleep apnea, no CPAP at night             GI:                               no difficulty swallowing, no reflux, no frequent heartburn, no hiatal hernia, no abdominal pain, no constipation, no diarrhea, no hematochezia, no hematemesis, no melena             GU:                              no dysuria,  no frequency, no urinary tract infection, no hematuria, no kidney stones, + chronic kidney disease             Vascular:                     no pain suggestive of claudication, no pain in feet, no leg cramps, no varicose veins, no DVT, no non-healing foot ulcer             Neuro:                         no stroke, no TIA's, no seizures, no  headaches, no temporary blindness one eye,  no slurred speech, no peripheral neuropathy, no chronic pain, no instability of gait, no memory/cognitive dysfunction             Musculoskeletal:         + arthritis, no joint swelling, no myalgias, no difficulty walking, normal mobility              Skin:                            no rash, no itching, no skin infections,  no pressure sores or ulcerations             Psych:                         no anxiety, no depression, no nervousness, no unusual recent stress             Eyes:                           no blurry vision, no floaters, no recent vision changes, + wears glasses              ENT:                            + hearing loss, no loose or painful teeth, no dentures, last saw dentist 10/19             Hematologic:               no easy bruising, no abnormal bleeding, no clotting disorder, no frequent epistaxis             Endocrine:                   no diabetes, does not check CBG's at home                                         Physical Exam:              BP (!) 142/72   Pulse 64   Resp 20   Ht  (1.6 m)   Wt 135 lb (61.2 kg)   SpO2 95% Comment: RA  BMI 23.91 kg/m              General:                      Elderly but  well-appearing             HEENT:                       Unremarkable, NCAT, PERLA, EOMI, oropharynx clear, teeth in good condition             Neck:                           no JVD, no bruits, no adenopathy or thyromegaly             Chest:                          clear to auscultation, symmetrical breath sounds, no wheezes, no rhonchi              CV:                              RRR, grade III/VI crescendo/decrescendo murmur heard best at RSB,  no diastolic murmur             Abdomen:                    soft, non-tender, no masses  Extremities:                 warm, well-perfused, pulses palpable in feet, moderate  LE edema in ankles and feet.             Rectal/GU                   Deferred              Neuro:                         Grossly non-focal and symmetrical throughout             Skin:                            Clean and dry, no rashes, no breakdown   Diagnostic Tests:  [facility]* [address] [place], [place] [address] [phone]  ------------------------------------------------------------------- Transthoracic Echocardiography  Patient: Sonique, Harlan MR #: [id number] Study Date: 06/17/2018 Gender: F Age: 51 Height: 162.6 cm Weight: 60.1 kg BSA: 1.65 m^2 Pt. Status: Room: 251A  SONOGRAPHER [facility] PERFORMING [place] ADMITTING Pyreddy, Pavan ORDERING Pyreddy, Pavan REFERRING Pyreddy, Pavan ATTENDING Salary, Montell D  cc:  ------------------------------------------------------------------- LV EF: 60% - 65%  ------------------------------------------------------------------- Indications: CHF - 428.0.  ------------------------------------------------------------------- History: Risk factors: Dyslipidemia.  ------------------------------------------------------------------- Study Conclusions  - Left ventricle: The cavity size was normal. Wall thickness was increased in a pattern of moderate LVH. There was moderate concentric hypertrophy. Systolic function was normal. The estimated ejection fraction was in the range of 60% to 65%. Wall motion was normal; there were no regional wall motion abnormalities. Doppler parameters are consistent with abnormal left ventricular relaxation (grade 1 diastolic dysfunction). - Aortic valve: There was mild regurgitation. Valve area (VTI): 0.52 cm^2. Valve area (Vmax): 0.42 cm^2. Valve area (Vmean): 0.52 cm^2. - Mitral valve: There was moderate regurgitation.  Impressions:  - Normal  LVF Normal Wall Motion EF=65% Normal Right side Mod LVH Critical AS Valve Area<0.5cm2. Normal study.  ------------------------------------------------------------------- Study data: Study status: Routine. Procedure: Transthoracic echocardiography. Image quality was adequate. Study completion: There were no complications. Transthoracic echocardiography. M-mode, complete 2D, spectral Doppler, and color Doppler. Birthdate: Patient birthdate: [birth year]. Age: Patient is 85 yr old. Sex: Gender: female. BMI: 22.7 kg/m^2. Blood pressure: 120/50 Patient status: Inpatient. Study date: Study date: 06/17/2018. Study time: 07:19 AM.  -------------------------------------------------------------------  ------------------------------------------------------------------- Left ventricle: The cavity size was normal. Wall thickness was increased in a pattern of moderate LVH. There was moderate concentric hypertrophy. Systolic function was normal. The estimated ejection fraction was in the range of 60% to 65%. Wall motion was normal; there were no regional wall motion abnormalities. Doppler parameters are consistent with abnormal left ventricular relaxation (grade 1 diastolic dysfunction).  ------------------------------------------------------------------- Aortic valve: Mod /Severe Ca# Severe/Critical AS Valve area<0.5cm2. Trileaflet; mildly thickened, mildly calcified leaflets. Mobility was not restricted. Doppler: Transvalvular velocity was within the normal range. There was no stenosis. There was mild regurgitation. VTI ratio of LVOT to aortic valve: 0.21. Valve area (VTI): 0.52 cm^2. Indexed valve area (VTI): 0.31 cm^2/m^2. Peak velocity ratio of LVOT to aortic valve: 0.16. Valve area (Vmax): 0.42 cm^2. Indexed valve area (Vmax): 0.25 cm^2/m^2. Mean velocity ratio of LVOT to aortic valve: 0.21. Valve area (Vmean): 0.52 cm^2. Indexed  valve area (Vmean): 0.32 cm^2/m^2. Mean gradient (S): 40 mm Hg. Peak gradient (S): 98 mm Hg.  -------------------------------------------------------------------  Aorta: The aorta was normal, not dilated, and non-diseased. Aortic root: The aortic root was normal in size.  ------------------------------------------------------------------- Mitral valve: Mildly thickened leaflets . Mobility was not restricted. Doppler: Transvalvular velocity was within the normal range. There was no evidence for stenosis. There was moderate regurgitation. Valve area by pressure half-time: 4.58 cm^2. Indexed valve area by pressure half-time: 2.77 cm^2/m^2. Peak gradient (D): 6 mm Hg.  ------------------------------------------------------------------- Left atrium: The atrium was normal in size.  ------------------------------------------------------------------- Atrial septum: Poorly visualized.  ------------------------------------------------------------------- Right ventricle: The cavity size was normal. Wall thickness was normal. Systolic function was normal. Systolic pressure was within the normal range.  ------------------------------------------------------------------- Pulmonic valve: Structurally normal valve. Cusp separation was normal. Doppler: Transvalvular velocity was within the normal range. There was no evidence for stenosis. There was no regurgitation.  ------------------------------------------------------------------- Tricuspid valve: Mildly thickened leaflets. Doppler: Transvalvular velocity was within the normal range. There was trivial regurgitation.  ------------------------------------------------------------------- Pulmonary artery: Poorly visualized. The main pulmonary artery was normal-sized. Systolic pressure was within the normal range.  ------------------------------------------------------------------- Right atrium: The atrium  was normal in size.  ------------------------------------------------------------------- Pericardium: The pericardium was normal in appearance. There was no pericardial effusion.  ------------------------------------------------------------------- Systemic veins: Inferior vena cava: The vessel was normal in size.  ------------------------------------------------------------------- Post procedure conclusions Ascending Aorta:  - The aorta was normal, not dilated, and non-diseased.  ------------------------------------------------------------------- Measurements  Left ventricle Value Reference LV ID, ED, PLAX chordal 48.2 mm 43 - 52 LV ID, ES, PLAX chordal 34.6 mm 23 - 38 LV fx shortening, PLAX chordal (L) 28 % >=29 LV PW thickness, ED 11.7 mm ---------- IVS/LV PW ratio, ED 1.07 <=1.3 Stroke volume, 2D 61 ml ---------- Stroke volume/bsa, 2D 37 ml/m^2 ----------  Ventricular septum Value Reference IVS thickness, ED 12.5 mm ----------  LVOT Value Reference LVOT ID, S 18 mm ---------- LVOT area 2.54 cm^2 ---------- LVOT peak velocity, S 81.2 cm/s ---------- LVOT mean velocity, S 57.7 cm/s ---------- LVOT VTI, S 24 cm ----------  Aortic valve Value Reference Aortic valve peak velocity, S 496 cm/s ---------- Aortic valve mean velocity, S 280 cm/s ---------- Aortic valve VTI, S 117 cm  ---------- Aortic mean gradient, S 40 mm Hg ---------- Aortic peak gradient, S 98 mm Hg ---------- VTI ratio, LVOT/AV 0.21 ---------- Aortic valve area, VTI 0.52 cm^2 ---------- Aortic valve area/bsa, VTI 0.31 cm^2/m^2 ---------- Velocity ratio, peak, LVOT/AV 0.16 ---------- Aortic valve area, peak velocity 0.42 cm^2 ---------- Aortic valve area/bsa, peak 0.25 cm^2/m^2 ---------- velocity Velocity ratio, mean, LVOT/AV 0.21 ---------- Aortic valve area, mean velocity 0.52 cm^2 ---------- Aortic valve area/bsa, mean 0.32 cm^2/m^2 ---------- velocity Aortic regurg pressure half-time 481 ms ----------  Aorta Value Reference Aortic root ID, ED 36 mm ----------  Left atrium Value Reference LA ID, A-P, ES 49 mm ---------- LA ID/bsa, A-P (H) 2.97 cm/m^2 <=2.2 LA volume, S 94.5 ml ---------- LA volume/bsa, S 57.2 ml/m^2 ---------- LA volume, ES, 1-p A4C 78.2 ml ---------- LA volume/bsa, ES, 1-p A4C 47.3 ml/m^2 ---------- LA volume, ES, 1-p A2C 114 ml ---------- LA volume/bsa, ES, 1-p A2C 69 ml/m^2 ----------  Mitral valve Value Reference Mitral E-wave peak velocity 127 cm/s ---------- Mitral A-wave peak velocity 135 cm/s ---------- Mitral deceleration time 165 ms 150 - 230 Mitral pressure half-time 48  ms ---------- Mitral peak gradient, D 6 mm Hg ---------- Mitral E/A ratio, peak 0.9 ---------- Mitral valve area, PHT, DP 4.58 cm^2 ---------- Mitral valve area/bsa, PHT, DP 2.77 cm^2/m^2 ---------- Mitral regurg VTI, PISA 268 cm ----------  Right atrium Value Reference RA ID, S-I, ES, A4C 49 mm 34 - 49 RA area, ES, A4C 12 cm^2 8.3 - 19.5 RA volume, ES, A/L 25.9 ml ---------- RA volume/bsa, ES, A/L 15.7 ml/m^2 ----------  Systemic veins Value Reference Estimated CVP 3 mm Hg ----------  Right ventricle Value Reference RV ID, ED, PLAX (H) 39.7 mm 19 - 38 TAPSE 18.2 mm ---------- RV s&', lateral, S 10.1 cm/s ----------  Pulmonic valve Value Reference Pulmonic regurg velocity, ED 103 cm/s ----------  Legend: (L) and (H) mark values outside specified reference range.  ------------------------------------------------------------------- Prepared and Electronically Authenticated by  Dorothyann Peng, MD 2019-12-28T18:34:16   Physicians   Panel Physicians Referring Physician Case Authorizing Physician  Tonny Bollman, MD (Primary)    Procedures   RIGHT HEART CATH AND CORONARY ANGIOGRAPHY  Conclusion     Mid RCA lesion is 30% stenosed.  There is severe aortic valve stenosis.  1. Calcified coronary arteries with minor nonobstructive CAD 2. Severely calcified aortic valve biplane fluoroscopy with known critical aortic stenosis by  noninvasive assessment 3. Moderate mitral annular calcification biplane fluoroscopy  Recommendations: Continue multidisciplinary TAVR evaluation   Indications   Severe aortic stenosis [I35.0 (ICD-10-CM)]  Procedural Details   Technical Details INDICATION: Critical aortic stenosis with congestive heart failure. This is a very spry, independent 85 year old woman presenting for right and left heart catheterization in preparation for TAVR  PROCEDURAL DETAILS: There was an indwelling IV in a right antecubital vein. Using normal sterile technique, the IV was changed out for a 5 Fr brachial sheath over a 0.018 inch wire. The right wrist was then prepped, draped, and anesthetized with 1% lidocaine. Using the modified Seldinger technique a 5/6 French Slender sheath was placed in the right radial artery. Intra-arterial verapamil was administered through the radial artery sheath. IV heparin was administered after a JR4 catheter was advanced into the central aorta. A Swan-Ganz catheter was used for the right heart catheterization. Standard protocol was followed for recording of right heart pressures and sampling of oxygen saturations. Fick cardiac output was calculated. Standard Judkins catheters were used for selective coronary angiography. There were no immediate procedural complications. The patient was transferred to the post catheterization recovery area for further monitoring.    Estimated blood loss <50 mL.   During this procedure medications were administered to achieve and maintain moderate conscious sedation while the patient's heart rate, blood pressure, and oxygen saturation were continuously monitored and I was present face-to-face 100% of this time.  Medications  (Filter: Administrations occurring from 07/25/18 1114 to 07/25/18 1230)          Medication Rate/Dose/Volume Action  Date Time   midazolam (VERSED) injection (mg) 1 mg Given 07/25/18 1134   Total dose as of 08/10/18 0920         1 mg        lidocaine (PF) (XYLOCAINE) 1 % injection (mL) 2 mL Given 07/25/18 1149   Total dose as of 08/10/18 0920 2 mL Given 1151   4 mL        Heparin (Porcine) in NaCl 1000-0.9 UT/500ML-% SOLN (mL) 500 mL Given 07/25/18 1159   Total dose as of 08/10/18 0920 500 mL Given 1159   1,500 mL 500 mL Given 1159   heparin injection (Units) 3,000 Units Given 07/25/18 1201   Total dose as of 08/10/18 0920        3,000 Units        iohexol (OMNIPAQUE) 350 MG/ML injection (mL) 30 mL  Given 07/25/18 1212   Total dose as of 08/10/18 0920        30 mL        Radial Cocktail/Verapamil only (mL) 10 mL Given 07/25/18 1155   Total dose as of 08/10/18 0920        10 mL        Sedation Time   Sedation Time Physician-1: 35 minutes 36 seconds  Coronary Findings   Diagnostic  Dominance: Right  Left Main  The left main is short. There is no stenosis present. The vessel divides into the LAD and left circumflex.  Left Anterior Descending  The vessel exhibits minimal luminal irregularities. The LAD is patent throughout. The vessel reaches the apex. There there is a single moderate sized diagonal without stenosis and a septal cascade without significant stenosis. The distal LAD has the appearance of a ' twin LAD' distribution  Left Circumflex  There is mild diffuse disease throughout the vessel. The circumflex is normal in caliber. There is calcification in the proximal circumflex without stenosis. There are diffuse irregularities but no significant stenoses. There are 2 obtuse marginal branches without stenosis.  First Obtuse Marginal Branch  There is mild disease in the vessel.  Second Obtuse Marginal Branch  There is mild disease in the vessel.  Right Coronary Artery  Mid RCA lesion 30% stenosed  Mid RCA lesion is 30% stenosed. The lesion is moderately calcified. The RCA is a normal caliber, dominant vessel. In the mid vessel there is  30 to 40% stenosis. There is no obstructive disease throughout the distribution of the RCA. The PDA and PLA branches are patent. The PDA branch is patent and the PLA branches are small.  Intervention   No interventions have been documented.  Left Heart   Mitral Valve The annulus is calcified.  Aortic Valve There is severe aortic valve stenosis. The aortic valve is calcified.  Coronary Diagrams   Diagnostic  Dominance: Right    Intervention   Implants    No implant documentation for this case.  Syngo Images      Show images for CARDIAC CATHETERIZATION  MERGE Images   Show images for CARDIAC CATHETERIZATION   Link to Procedure Log   Procedure Log    Hemo Data    Most Recent Value  Fick Cardiac Output 6.14 L/min  Fick Cardiac Output Index 3.73 (L/min)/BSA  RA A Wave 4 mmHg  RA V Wave 3 mmHg  RA Mean 2 mmHg  RV Systolic Pressure 36 mmHg  RV Diastolic Pressure 1 mmHg  RV EDP 4 mmHg  PA Systolic Pressure 37 mmHg  PA Diastolic Pressure 8 mmHg  PA Mean 18 mmHg  PW A Wave 13 mmHg  PW V Wave 10 mmHg  PW Mean 9 mmHg  AO Systolic Pressure 116 mmHg  AO Diastolic Pressure 43 mmHg  AO Mean 69 mmHg  QP/QS 1  TPVR Index 4.83 HRUI  TSVR Index 18.49 HRUI  PVR SVR Ratio 0.13  TPVR/TSVR Ratio 0.26    ADDENDUM REPORT: 08/08/2018 11:36  CLINICAL DATA: Aortic stenosis  EXAM: Cardiac TAVR CT  TECHNIQUE: The patient was scanned on a Siemens Force 192 slice scanner. A 120 kV retrospective scan was triggered in the descending thoracic aorta at 111 HU's. Gantry rotation speed was 270 msecs and collimation was .9 mm. No beta blockade or nitro were given. The 3D data set was reconstructed in 5% intervals of the R-R cycle. Systolic and diastolic phases were analyzed on a dedicated  work station using MPR, MIP and VRT modes. The patient received 80 cc of contrast.  FINDINGS: Aortic Valve: Tri leaflet severely calcified with restricted  leaflet motion  Aorta: Non aneurysmal normal arch vessels. Severe calcification of the STJ. Nodular calcification at the base of the non and right cusps  Sinotubular Junction: 26 mm > 75% circumferential calcification  Ascending Thoracic Aorta: 32 mm  Aortic Arch: 28 mm  Descending Thoracic Aorta: 22 mm  Sinus of Valsalva Measurements:  Non-coronary: 31 mm  Right - coronary: 29.6 mm  Left - coronary: 29.8 mm  Coronary Artery Height above Annulus:  Left Main: 10.2 mm above annulus  Right Coronary: 18.8 mm above annulus  Virtual Basal Annulus Measurements:  Maximum/Minimum Diameter: 28.1 mm x 19.9 mm  Perimeter: 77 mm  Area: 445 mm2  Coronary Arteries: Sufficient height above annulus for deployment  Optimum Fluoroscopic Angle for Delivery: LAO 14 Caudal 14 degrees  IMPRESSION: 1. Tri- leaflet AV with annular area of 445 mm2 suitable for a 26 mm Sapien 3 valve  2. Optimum angiographic angle for deployment LAO 14 Caudal 14 degrees  3. Coronary arteries sufficient height above annulus for deployment  4. Normal aortic root 3.2 cm with > 75% circumferential calcification of the STJ and bulky nodular calcification at the base of the non and right coronary cusps  Charlton Haws   Electronically Signed By: Charlton Haws M.D. On: 08/08/2018 11:36   CLINICAL DATA: 85 year old female with history of severe aortic stenosis. Preprocedural study prior to potential transcatheter aortic valve replacement (TAVR).  EXAM: CT ANGIOGRAPHY CHEST, ABDOMEN AND PELVIS  TECHNIQUE: Multidetector CT imaging through the chest, abdomen and pelvis was performed using the standard protocol during bolus administration of intravenous contrast. Multiplanar reconstructed images and MIPs were obtained and reviewed to evaluate the vascular anatomy.  CONTRAST: ISOVUE-370 IOPAMIDOL (ISOVUE-370) INJECTION 76%  COMPARISON: No  priors.  FINDINGS: CTA CHEST FINDINGS  Cardiovascular: Heart size is normal. There is no significant pericardial fluid, thickening or pericardial calcification. There is aortic atherosclerosis, as well as atherosclerosis of the great vessels of the mediastinum and the coronary arteries, including calcified atherosclerotic plaque in the left main, left anterior descending, left circumflex and right coronary arteries. Severe thickening and calcification of the aortic valve. Calcifications of the mitral annulus.  Mediastinum/Lymph Nodes: No pathologically enlarged mediastinal or hilar lymph nodes. Esophagus is unremarkable in appearance. No axillary lymphadenopathy.  Lungs/Pleura: Lung apices are incompletely imaged. With this limitation in mind, there are no definite suspicious appearing pulmonary nodules or masses noted. No acute consolidative airspace disease. No pleural effusions. Scattered areas of mild peripheral predominant septal thickening, most evident throughout the mid to lower lungs. Scattered areas of very mild cylindrical bronchiectasis.  Musculoskeletal/Soft Tissues: There are no aggressive appearing lytic or blastic lesions noted in the visualized portions of the skeleton.  CTA ABDOMEN AND PELVIS FINDINGS  Hepatobiliary: Small calcified granuloma in the liver. No suspicious cystic or solid hepatic lesions. No intra or extrahepatic biliary ductal dilatation. Gallbladder is normal in appearance.  Pancreas: No pancreatic mass. No pancreatic ductal dilatation. No pancreatic or peripancreatic fluid or inflammatory changes.  Spleen: Unremarkable.  Adrenals/Urinary Tract: Low-attenuation lesions in both kidneys, the largest of which is in the interpolar region of the right kidney measuring 1.8 cm in diameter. Mild cortical thinning in both kidneys. No suspicious renal lesions. Bilateral adrenal glands are normal in appearance. No hydroureteronephrosis.  Urinary bladder is normal in appearance.  Stomach/Bowel: Normal appearance of the stomach. No  pathologic dilatation of small bowel or colon. Numerous colonic diverticulae are noted, without surrounding inflammatory changes to suggest an acute diverticulitis at this time. Normal appendix.  Vascular/Lymphatic: Aortic atherosclerosis, without evidence of aneurysm or dissection in the abdominal or pelvic vasculature. Vascular findings and measurements pertinent to potential TAVR procedure, as detailed below. No lymphadenopathy noted in the abdomen or pelvis.  Reproductive: Uterus and ovaries are atrophic.  Other: No significant volume of ascites. No pneumoperitoneum.  Musculoskeletal: There are no aggressive appearing lytic or blastic lesions noted in the visualized portions of the skeleton.  VASCULAR MEASUREMENTS PERTINENT TO TAVR:  AORTA:  Minimal Aortic Diameter-9 x 9 mm  Severity of Aortic Calcification-severe  RIGHT PELVIS:  Right Common Iliac Artery -  Minimal Diameter-6.6 x 6.4 mm  Tortuosity-mild  Calcification-moderate to severe  Right External Iliac Artery -  Minimal Diameter-5.8 x 6.0 mm  Tortuosity - mild  Calcification-none  Right Common Femoral Artery -  Minimal Diameter-6.2 x 5.2 mm  Tortuosity - mild  Calcification-mild  LEFT PELVIS:  Left Common Iliac Artery -  Minimal Diameter-7.8 x 7.0 mm  Tortuosity - mild  Calcification-moderate to severe  Left External Iliac Artery -  Minimal Diameter-6.1 x 6.3 mm  Tortuosity - mild  Calcification-none  Left Common Femoral Artery -  Minimal Diameter-6.6 x 5.8 mm  Tortuosity - mild  Calcification-mild  Review of the MIP images confirms the above findings.  IMPRESSION: 1. Vascular findings and measurements pertinent to potential TAVR procedure, as detailed above. 2. Severe thickening calcification of the aortic valve, compatible with the  reported clinical history of severe aortic stenosis. 3. Aortic atherosclerosis, in addition to left main and 3 vessel coronary artery disease. 4. Changes in the lungs which may suggest developing interstitial lung disease. Outpatient referral to Pulmonology for further evaluation should be considered if clinically appropriate. 5. Colonic diverticulosis without findings of acute diverticulitis at this time. 6. Additional incidental findings, as above.   Electronically Signed By: Trudie Reed M.D. On: 08/08/2018 11:56    [facility] Adult Cardiac Surgery Database Version 2.9 RISK SCORES Procedure: Isolated AVR CALCULATE  Risk of Mortality:  4.443%   Renal Failure:  2.036%   Permanent Stroke:  3.165%   Prolonged Ventilation:  9.195%   DSW Infection:  0.045%   Reoperation:  3.510%   Morbidity or Mortality:  14.967%   Short Length of Stay:  19.588%   Long Length of Stay:  7.609%    Impression:  This 85 year old woman has stage D, severe, symptomatic aortic stenosis with recent hospitalization for acute diastolic congestive heart failure with pulmonary edema.  She improved with diuresis and now has New York Heart Association class II symptoms of exertional shortness of breath.  She has not been very active since her admission in December.  I have personally reviewed her 2D echocardiogram, cardiac catheterization, and CTA studies.  Her echocardiogram shows a trileaflet aortic valve with heavily calcified leaflets and restricted mobility.  The mean gradient is 40 mmHg with a peak gradient of 98 mmHg consistent with severe aortic stenosis.  Left ventricular systolic function is normal.  Cardiac catheterization shows calcified coronary arteries with minor nonobstructive coronary disease.  I agree that aortic valve replacement is indicated in this patient to prevent recurrent episodes of acute diastolic congestive heart failure and progressive left ventricular deterioration.  I think  transcatheter aortic valve replacement would be the best option for her given her advanced age.  Her gated cardiac CTA shows anatomy suitable for transcatheter aortic  valve replacement using a 26 mm Sapien 3 valve.  There is greater than 75% circumferential calcification of the sinotubular junction which was measured at 26 mm in diameter on the CTA report and on our remeasurement was 28 mm.  She also has bulky nodular calcification of the base of the non-and right coronary cusps which may increase the risk of perivalvular leak but I do not think are prohibitive.  Her abdominal and pelvic CTA shows adequate pelvic vascular anatomy to allow transfemoral insertion.  The patient and her daughter and son-in-law were counseled at length regarding treatment alternatives for management of severe symptomatic aortic stenosis. The risks and benefits of surgical intervention has been discussed in detail. Long-term prognosis with medical therapy was discussed. Alternative approaches such as conventional surgical aortic valve replacement, transcatheter aortic valve replacement, and palliative medical therapy were compared and contrasted at length. This discussion was placed in the context of the patient's own specific clinical presentation and past medical history. All of their questions have been addressed.   Following the decision to proceed with transcatheter aortic valve replacement, a discussion was held regarding what types of management strategies would be attempted intraoperatively in the event of life-threatening complications, including whether or not the patient would be considered a candidate for the use of cardiopulmonary bypass and/or conversion to open sternotomy for attempted surgical intervention. The patient is aware of the fact that transient use of cardiopulmonary bypass may be necessary.  Given her age of 64 years I do not think she would be a candidate for emergent median sternotomy for any  intraoperative complications.  The patient has been advised of a variety of complications that might develop including but not limited to risks of death, stroke, paravalvular leak, aortic dissection or other major vascular complications, aortic annulus rupture, device embolization, cardiac rupture or perforation, mitral regurgitation, acute myocardial infarction, arrhythmia, heart block or bradycardia requiring permanent pacemaker placement, congestive heart failure, respiratory failure, renal failure, pneumonia, infection, other late complications related to structural valve deterioration or migration, or other complications that might ultimately cause a temporary or permanent loss of functional independence or other long term morbidity. The patient provides full informed consent for the procedure as described and all questions were answered.    Plan:  Transfemoral transcatheter aortic valve replacement.    [name], MD

## 2018-08-15 ENCOUNTER — Encounter (HOSPITAL_COMMUNITY)
Admission: RE | Disposition: A | Discharge status: Home / Self Care | Payer: Self-pay | Source: Home / Self Care | Attending: Cardiovascular Disease

## 2018-08-15 ENCOUNTER — Inpatient Hospital Stay (HOSPITAL_COMMUNITY): Payer: Medicare Other

## 2018-08-15 ENCOUNTER — Encounter (HOSPITAL_COMMUNITY): Payer: Self-pay | Admitting: *Deleted

## 2018-08-15 ENCOUNTER — Inpatient Hospital Stay (HOSPITAL_COMMUNITY)
Admission: RE | Admit: 2018-08-15 | Discharge: 2018-08-17 | DRG: 266 | Disposition: A | Discharge status: Home / Self Care | Payer: Medicare Other | Attending: Cardiovascular Disease | Admitting: Cardiovascular Disease

## 2018-08-15 ENCOUNTER — Inpatient Hospital Stay (HOSPITAL_COMMUNITY): Payer: Medicare Other | Admitting: Certified Registered"

## 2018-08-15 ENCOUNTER — Other Ambulatory Visit: Payer: Self-pay

## 2018-08-15 DIAGNOSIS — I350 Nonrheumatic aortic (valve) stenosis: Principal | ICD-10-CM

## 2018-08-15 DIAGNOSIS — Z952 Presence of prosthetic heart valve: Secondary | ICD-10-CM

## 2018-08-15 DIAGNOSIS — Z880 Allergy status to penicillin: Secondary | ICD-10-CM

## 2018-08-15 DIAGNOSIS — Z7982 Long term (current) use of aspirin: Secondary | ICD-10-CM

## 2018-08-15 DIAGNOSIS — N183 Chronic kidney disease, stage 3 unspecified: Secondary | ICD-10-CM | POA: Diagnosis present

## 2018-08-15 DIAGNOSIS — Z825 Family history of asthma and other chronic lower respiratory diseases: Secondary | ICD-10-CM | POA: Diagnosis not present

## 2018-08-15 DIAGNOSIS — Z006 Encounter for examination for normal comparison and control in clinical research program: Secondary | ICD-10-CM

## 2018-08-15 DIAGNOSIS — I5033 Acute on chronic diastolic (congestive) heart failure: Secondary | ICD-10-CM | POA: Diagnosis present

## 2018-08-15 DIAGNOSIS — Z79899 Other long term (current) drug therapy: Secondary | ICD-10-CM

## 2018-08-15 DIAGNOSIS — I340 Nonrheumatic mitral (valve) insufficiency: Secondary | ICD-10-CM | POA: Diagnosis not present

## 2018-08-15 DIAGNOSIS — J9811 Atelectasis: Secondary | ICD-10-CM | POA: Diagnosis not present

## 2018-08-15 DIAGNOSIS — E785 Hyperlipidemia, unspecified: Secondary | ICD-10-CM | POA: Diagnosis not present

## 2018-08-15 DIAGNOSIS — I10 Essential (primary) hypertension: Secondary | ICD-10-CM | POA: Diagnosis present

## 2018-08-15 DIAGNOSIS — I130 Hypertensive heart and chronic kidney disease with heart failure and stage 1 through stage 4 chronic kidney disease, or unspecified chronic kidney disease: Secondary | ICD-10-CM | POA: Diagnosis present

## 2018-08-15 DIAGNOSIS — Z833 Family history of diabetes mellitus: Secondary | ICD-10-CM | POA: Diagnosis not present

## 2018-08-15 DIAGNOSIS — Z8249 Family history of ischemic heart disease and other diseases of the circulatory system: Secondary | ICD-10-CM | POA: Diagnosis not present

## 2018-08-15 DIAGNOSIS — I5032 Chronic diastolic (congestive) heart failure: Secondary | ICD-10-CM | POA: Diagnosis not present

## 2018-08-15 HISTORY — PX: TEE WITHOUT CARDIOVERSION: SHX5443

## 2018-08-15 HISTORY — DX: Presence of prosthetic heart valve: Z95.2

## 2018-08-15 HISTORY — PX: TRANSCATHETER AORTIC VALVE REPLACEMENT, TRANSFEMORAL: SHX6400

## 2018-08-15 LAB — POCT I-STAT 7, (LYTES, BLD GAS, ICA,H+H)
ACID-BASE EXCESS: 1 mmol/L (ref 0.0–2.0)
Bicarbonate: 26.3 mmol/L (ref 20.0–28.0)
Calcium, Ion: 1.19 mmol/L (ref 1.15–1.40)
HCT: 25 % — ABNORMAL LOW (ref 36.0–46.0)
Hemoglobin: 8.5 g/dL — ABNORMAL LOW (ref 12.0–15.0)
O2 Saturation: 93 %
PH ART: 7.403 (ref 7.350–7.450)
PO2 ART: 68 mmHg — AB (ref 83.0–108.0)
Potassium: 4.2 mmol/L (ref 3.5–5.1)
Sodium: 140 mmol/L (ref 135–145)
TCO2: 28 mmol/L (ref 22–32)
pCO2 arterial: 42.2 mmHg (ref 32.0–48.0)

## 2018-08-15 LAB — POCT I-STAT 4, (NA,K, GLUC, HGB,HCT)
Glucose, Bld: 103 mg/dL — ABNORMAL HIGH (ref 70–99)
HCT: 23 % — ABNORMAL LOW (ref 36.0–46.0)
HEMOGLOBIN: 7.8 g/dL — AB (ref 12.0–15.0)
Potassium: 4.2 mmol/L (ref 3.5–5.1)
Sodium: 141 mmol/L (ref 135–145)

## 2018-08-15 LAB — POCT I-STAT CREATININE: Creatinine, Ser: 0.9 mg/dL (ref 0.44–1.00)

## 2018-08-15 SURGERY — IMPLANTATION, AORTIC VALVE, TRANSCATHETER, FEMORAL APPROACH
Anesthesia: Monitor Anesthesia Care | Site: Chest

## 2018-08-15 MED ORDER — ACETAMINOPHEN 650 MG RE SUPP: 650.0000 mg | Freq: Four times a day (QID) | RECTAL | Status: DC | PRN

## 2018-08-15 MED ORDER — FENTANYL CITRATE (PF) 250 MCG/5ML IJ SOLN
INTRAMUSCULAR | Status: DC | PRN
  Administered 2018-08-15: 50 ug via INTRAVENOUS

## 2018-08-15 MED ORDER — SODIUM CHLORIDE 0.9 % IV SOLN: 250.0000 mL | INTRAVENOUS | Status: DC | PRN

## 2018-08-15 MED ORDER — PROPOFOL 10 MG/ML IV BOLUS
INTRAVENOUS | Status: AC
  Filled 2018-08-15: qty 20

## 2018-08-15 MED ORDER — SODIUM CHLORIDE 0.9 % IV SOLN
INTRAVENOUS | Status: DC
  Administered 2018-08-15: 12:00:00 via INTRAVENOUS

## 2018-08-15 MED ORDER — NITROGLYCERIN IN D5W 200-5 MCG/ML-% IV SOLN: 0.0000 ug/min | INTRAVENOUS | Status: DC

## 2018-08-15 MED ORDER — SODIUM CHLORIDE 0.9 % IV SOLN
INTRAVENOUS | Status: DC | PRN
  Administered 2018-08-15: 14:00:00

## 2018-08-15 MED ORDER — CLOPIDOGREL BISULFATE 75 MG PO TABS
75.0000 mg | ORAL_TABLET | Freq: Every day | ORAL | Status: DC
  Administered 2018-08-16 – 2018-08-17 (×2): 75 mg via ORAL
  Filled 2018-08-15 (×2): qty 1

## 2018-08-15 MED ORDER — PROPOFOL 10 MG/ML IV BOLUS
INTRAVENOUS | Status: DC | PRN
  Administered 2018-08-15: 20 mg via INTRAVENOUS

## 2018-08-15 MED ORDER — CHLORHEXIDINE GLUCONATE 4 % EX LIQD: 60.0000 mL | Freq: Once | CUTANEOUS | Status: DC

## 2018-08-15 MED ORDER — SIMVASTATIN 20 MG PO TABS
20.0000 mg | ORAL_TABLET | Freq: Every evening | ORAL | Status: DC
  Administered 2018-08-16: 20 mg via ORAL
  Filled 2018-08-15: qty 1

## 2018-08-15 MED ORDER — GLYCOPYRROLATE PF 0.2 MG/ML IJ SOSY
PREFILLED_SYRINGE | INTRAMUSCULAR | Status: AC
  Filled 2018-08-15: qty 2

## 2018-08-15 MED ORDER — PROTAMINE SULFATE 10 MG/ML IV SOLN
INTRAVENOUS | Status: DC | PRN
  Administered 2018-08-15: 90 mg via INTRAVENOUS

## 2018-08-15 MED ORDER — LIDOCAINE HCL (PF) 1 % IJ SOLN
INTRAMUSCULAR | Status: AC
  Filled 2018-08-15: qty 30

## 2018-08-15 MED ORDER — MIDAZOLAM HCL 2 MG/2ML IJ SOLN
INTRAMUSCULAR | Status: AC
  Filled 2018-08-15: qty 2

## 2018-08-15 MED ORDER — FENTANYL CITRATE (PF) 250 MCG/5ML IJ SOLN
INTRAMUSCULAR | Status: AC
  Filled 2018-08-15: qty 5

## 2018-08-15 MED ORDER — TRAMADOL HCL 50 MG PO TABS: 50.0000 mg | ORAL_TABLET | ORAL | Status: DC | PRN

## 2018-08-15 MED ORDER — SODIUM CHLORIDE 0.9% FLUSH
3.0000 mL | Freq: Two times a day (BID) | INTRAVENOUS | Status: DC
  Administered 2018-08-16 – 2018-08-17 (×2): 3 mL via INTRAVENOUS

## 2018-08-15 MED ORDER — PROPOFOL 500 MG/50ML IV EMUL
INTRAVENOUS | Status: DC | PRN
  Administered 2018-08-15: 25 ug/kg/min via INTRAVENOUS

## 2018-08-15 MED ORDER — DEXAMETHASONE SODIUM PHOSPHATE 10 MG/ML IJ SOLN
INTRAMUSCULAR | Status: DC | PRN
  Administered 2018-08-15: 5 mg via INTRAVENOUS

## 2018-08-15 MED ORDER — CHLORHEXIDINE GLUCONATE 0.12 % MT SOLN
15.0000 mL | Freq: Once | OROMUCOSAL | Status: AC
  Administered 2018-08-15: 15 mL via OROMUCOSAL
  Filled 2018-08-15: qty 15

## 2018-08-15 MED ORDER — HEPARIN SODIUM (PORCINE) 1000 UNIT/ML IJ SOLN
INTRAMUSCULAR | Status: DC | PRN
  Administered 2018-08-15: 9000 [IU] via INTRAVENOUS

## 2018-08-15 MED ORDER — LIDOCAINE HCL (PF) 1 % IJ SOLN
INTRAMUSCULAR | Status: DC | PRN
  Administered 2018-08-15: 6 mL

## 2018-08-15 MED ORDER — 0.9 % SODIUM CHLORIDE (POUR BTL) OPTIME
TOPICAL | Status: DC | PRN
  Administered 2018-08-15: 1000 mL

## 2018-08-15 MED ORDER — ONDANSETRON HCL 4 MG/2ML IJ SOLN
INTRAMUSCULAR | Status: DC | PRN
  Administered 2018-08-15: 4 mg via INTRAVENOUS

## 2018-08-15 MED ORDER — MORPHINE SULFATE (PF) 2 MG/ML IV SOLN: 1.0000 mg | INTRAVENOUS | Status: DC | PRN

## 2018-08-15 MED ORDER — LEVOFLOXACIN IN D5W 750 MG/150ML IV SOLN: 750.0000 mg | INTRAVENOUS | Status: DC

## 2018-08-15 MED ORDER — PROTAMINE SULFATE 10 MG/ML IV SOLN
INTRAVENOUS | Status: AC
  Filled 2018-08-15: qty 10

## 2018-08-15 MED ORDER — PROPOFOL 1000 MG/100ML IV EMUL
INTRAVENOUS | Status: AC
  Filled 2018-08-15: qty 100

## 2018-08-15 MED ORDER — ACETAMINOPHEN 325 MG PO TABS: 650.0000 mg | ORAL_TABLET | Freq: Four times a day (QID) | ORAL | Status: DC | PRN

## 2018-08-15 MED ORDER — ONDANSETRON HCL 4 MG/2ML IJ SOLN: 4.0000 mg | Freq: Four times a day (QID) | INTRAMUSCULAR | Status: DC | PRN

## 2018-08-15 MED ORDER — HEPARIN SODIUM (PORCINE) 1000 UNIT/ML IJ SOLN
INTRAMUSCULAR | Status: AC
  Filled 2018-08-15: qty 1

## 2018-08-15 MED ORDER — FENTANYL CITRATE (PF) 100 MCG/2ML IJ SOLN
INTRAMUSCULAR | Status: AC
  Administered 2018-08-15: 50 ug
  Filled 2018-08-15: qty 2

## 2018-08-15 MED ORDER — SODIUM CHLORIDE 0.9% FLUSH: 3.0000 mL | INTRAVENOUS | Status: DC | PRN

## 2018-08-15 MED ORDER — VANCOMYCIN HCL IN DEXTROSE 1-5 GM/200ML-% IV SOLN: 1000.0000 mg | Freq: Once | INTRAVENOUS | Status: DC

## 2018-08-15 MED ORDER — LEVOFLOXACIN IN D5W 500 MG/100ML IV SOLN
500.0000 mg | INTRAVENOUS | Status: AC
  Administered 2018-08-16: 500 mg via INTRAVENOUS
  Filled 2018-08-15: qty 100

## 2018-08-15 MED ORDER — CHLORHEXIDINE GLUCONATE 4 % EX LIQD: 30.0000 mL | CUTANEOUS | Status: DC

## 2018-08-15 MED ORDER — OXYCODONE HCL 5 MG PO TABS: 5.0000 mg | ORAL_TABLET | ORAL | Status: DC | PRN

## 2018-08-15 MED ORDER — PHENYLEPHRINE HCL-NACL 20-0.9 MG/250ML-% IV SOLN
0.0000 ug/min | INTRAVENOUS | Status: DC
  Filled 2018-08-15: qty 250

## 2018-08-15 MED ORDER — PROTAMINE SULFATE 10 MG/ML IV SOLN
INTRAVENOUS | Status: AC
  Filled 2018-08-15: qty 25

## 2018-08-15 MED ORDER — LACTATED RINGERS IV SOLN
INTRAVENOUS | Status: DC | PRN
  Administered 2018-08-15: 16:00:00 via INTRAVENOUS

## 2018-08-15 MED ORDER — METOPROLOL TARTRATE 5 MG/5ML IV SOLN: 2.5000 mg | INTRAVENOUS | Status: DC | PRN

## 2018-08-15 MED ORDER — ASPIRIN EC 81 MG PO TBEC
81.0000 mg | DELAYED_RELEASE_TABLET | Freq: Every day | ORAL | Status: DC
  Administered 2018-08-16 – 2018-08-17 (×2): 81 mg via ORAL
  Filled 2018-08-15 (×2): qty 1

## 2018-08-15 MED ORDER — SODIUM CHLORIDE 0.9 % IV SOLN
INTRAVENOUS | Status: AC
  Filled 2018-08-15 (×3): qty 1.2

## 2018-08-15 MED ORDER — IODIXANOL 320 MG/ML IV SOLN
INTRAVENOUS | Status: DC | PRN
  Administered 2018-08-15: 40.4 mL via INTRAVENOUS

## 2018-08-15 MED ORDER — SODIUM CHLORIDE 0.9 % IV SOLN: INTRAVENOUS | Status: DC

## 2018-08-15 SURGICAL SUPPLY — 94 items
BAG DECANTER FOR FLEXI CONT (MISCELLANEOUS) IMPLANT
BAG SNAP BAND KOVER 36X36 (MISCELLANEOUS) ×8 IMPLANT
BLADE CLIPPER SURG (BLADE) IMPLANT
BLADE STERNUM SYSTEM 6 (BLADE) IMPLANT
BLADE SURG 10 STRL SS (BLADE) ×4 IMPLANT
CABLE ADAPT CONN TEMP 6FT (ADAPTER) ×4 IMPLANT
CANISTER SUCT 3000ML PPV (MISCELLANEOUS) IMPLANT
CANNULA FEM VENOUS REMOTE 22FR (CANNULA) IMPLANT
CANNULA OPTISITE PERFUSION 16F (CANNULA) IMPLANT
CANNULA OPTISITE PERFUSION 18F (CANNULA) IMPLANT
CATH DIAG EXPO 6F VENT PIG 145 (CATHETERS) ×8 IMPLANT
CATH EXPO 5FR AL1 (CATHETERS) IMPLANT
CATH EXTERNAL FEMALE PUREWICK (CATHETERS) IMPLANT
CATH INFINITI 6F AL2 (CATHETERS) ×2 IMPLANT
CATH S G BIP PACING (CATHETERS) ×4 IMPLANT
CHLORAPREP W/TINT 26ML (MISCELLANEOUS) ×4 IMPLANT
CLIP VESOCCLUDE MED 24/CT (CLIP) ×4 IMPLANT
CLIP VESOCCLUDE SM WIDE 24/CT (CLIP) ×4 IMPLANT
CLOSURE MYNX CONTROL 6F/7F (Vascular Products) ×2 IMPLANT
CONT SPEC 4OZ CLIKSEAL STRL BL (MISCELLANEOUS) ×8 IMPLANT
COVER BACK TABLE 80X110 HD (DRAPES) IMPLANT
COVER DOME SNAP 22 D (MISCELLANEOUS) IMPLANT
COVER WAND RF STERILE (DRAPES) ×2 IMPLANT
CRADLE DONUT ADULT HEAD (MISCELLANEOUS) ×4 IMPLANT
DECANTER SPIKE VIAL GLASS SM (MISCELLANEOUS) ×4 IMPLANT
DERMABOND ADVANCED (GAUZE/BANDAGES/DRESSINGS) ×2
DERMABOND ADVANCED .7 DNX12 (GAUZE/BANDAGES/DRESSINGS) ×2 IMPLANT
DEVICE CLOSURE PERCLS PRGLD 6F (VASCULAR PRODUCTS) ×4 IMPLANT
DRAPE INCISE IOBAN 66X45 STRL (DRAPES) IMPLANT
DRSG TEGADERM 4X4.75 (GAUZE/BANDAGES/DRESSINGS) ×8 IMPLANT
ELECT CAUTERY BLADE 6.4 (BLADE) IMPLANT
ELECT REM PT RETURN 9FT ADLT (ELECTROSURGICAL) ×8
ELECTRODE REM PT RTRN 9FT ADLT (ELECTROSURGICAL) ×4 IMPLANT
FELT TEFLON 6X6 (MISCELLANEOUS) ×4 IMPLANT
FEMORAL VENOUS CANN RAP (CANNULA) IMPLANT
GAUZE SPONGE 4X4 12PLY STRL (GAUZE/BANDAGES/DRESSINGS) ×6 IMPLANT
GLOVE BIO SURGEON STRL SZ7.5 (GLOVE) IMPLANT
GLOVE BIO SURGEON STRL SZ8 (GLOVE) IMPLANT
GLOVE EUDERMIC 7 POWDERFREE (GLOVE) IMPLANT
GLOVE ORTHO TXT STRL SZ7.5 (GLOVE) IMPLANT
GOWN STRL REUS W/ TWL LRG LVL3 (GOWN DISPOSABLE) IMPLANT
GOWN STRL REUS W/ TWL XL LVL3 (GOWN DISPOSABLE) ×2 IMPLANT
GOWN STRL REUS W/TWL LRG LVL3 (GOWN DISPOSABLE)
GOWN STRL REUS W/TWL XL LVL3 (GOWN DISPOSABLE) ×2
GUIDEWIRE SAF TJ AMPL .035X180 (WIRE) ×4 IMPLANT
GUIDEWIRE SAFE TJ AMPLATZ EXST (WIRE) ×4 IMPLANT
GUIDEWIRE STRAIGHT .035 260CM (WIRE) ×4 IMPLANT
INSERT FOGARTY SM (MISCELLANEOUS) IMPLANT
KIT BASIN OR (CUSTOM PROCEDURE TRAY) ×4 IMPLANT
KIT DILATOR VASC 18G NDL (KITS) IMPLANT
KIT HEART LEFT (KITS) ×4 IMPLANT
KIT SUCTION CATH 14FR (SUCTIONS) ×8 IMPLANT
KIT TURNOVER KIT B (KITS) ×4 IMPLANT
LOOP VESSEL MAXI BLUE (MISCELLANEOUS) IMPLANT
LOOP VESSEL MINI RED (MISCELLANEOUS) IMPLANT
NDL PERC 18GX7CM (NEEDLE) ×2 IMPLANT
NEEDLE 22X1 1/2 (OR ONLY) (NEEDLE) ×4 IMPLANT
NEEDLE PERC 18GX7CM (NEEDLE) ×4 IMPLANT
NS IRRIG 1000ML POUR BTL (IV SOLUTION) ×4 IMPLANT
PACK ENDO MINOR (CUSTOM PROCEDURE TRAY) ×4 IMPLANT
PAD ARMBOARD 7.5X6 YLW CONV (MISCELLANEOUS) ×8 IMPLANT
PAD ELECT DEFIB RADIOL ZOLL (MISCELLANEOUS) ×4 IMPLANT
PENCIL BUTTON HOLSTER BLD 10FT (ELECTRODE) IMPLANT
PERCLOSE PROGLIDE 6F (VASCULAR PRODUCTS) ×8
SET MICROPUNCTURE 5F STIFF (MISCELLANEOUS) ×4 IMPLANT
SHEATH BRITE TIP 6FR 35CM (SHEATH) ×4 IMPLANT
SHEATH PINNACLE 6F 10CM (SHEATH) ×4 IMPLANT
SHEATH PINNACLE 8F 10CM (SHEATH) ×4 IMPLANT
SLEEVE REPOSITIONING LENGTH 30 (MISCELLANEOUS) ×4 IMPLANT
SPONGE LAP 4X18 RFD (DISPOSABLE) ×4 IMPLANT
STOPCOCK MORSE 400PSI 3WAY (MISCELLANEOUS) ×8 IMPLANT
SUT ETHIBOND X763 2 0 SH 1 (SUTURE) IMPLANT
SUT GORETEX CV 4 TH 22 36 (SUTURE) IMPLANT
SUT GORETEX CV4 TH-18 (SUTURE) IMPLANT
SUT MNCRL AB 3-0 PS2 18 (SUTURE) IMPLANT
SUT PROLENE 5 0 C 1 36 (SUTURE) IMPLANT
SUT PROLENE 6 0 C 1 30 (SUTURE) IMPLANT
SUT SILK  1 MH (SUTURE) ×2
SUT SILK 1 MH (SUTURE) ×2 IMPLANT
SUT VIC AB 2-0 CT1 27 (SUTURE)
SUT VIC AB 2-0 CT1 TAPERPNT 27 (SUTURE) IMPLANT
SUT VIC AB 2-0 CTX 36 (SUTURE) IMPLANT
SUT VIC AB 3-0 SH 8-18 (SUTURE) IMPLANT
SYR 50ML LL SCALE MARK (SYRINGE) ×4 IMPLANT
SYR BULB IRRIGATION 50ML (SYRINGE) IMPLANT
SYR CONTROL 10ML LL (SYRINGE) IMPLANT
TOWEL GREEN STERILE (TOWEL DISPOSABLE) ×8 IMPLANT
TRANSDUCER W/STOPCOCK (MISCELLANEOUS) ×8 IMPLANT
TRAY FOLEY SLVR 14FR TEMP STAT (SET/KITS/TRAYS/PACK) IMPLANT
TUBE SUCT INTRACARD DLP 20F (MISCELLANEOUS) IMPLANT
URINAL MALE W/LID DISP 1000CC (MISCELLANEOUS) IMPLANT
VALVE HEART TRANSCATH SZ3 26MM (Valve) ×2 IMPLANT
WIRE .035 3MM-J 145CM (WIRE) ×4 IMPLANT
WIRE BENTSON .035X145CM (WIRE) ×4 IMPLANT

## 2018-08-15 NOTE — Anesthesia Procedure Notes (Signed)
Arterial Line Insertion Start/End2/25/2020 2:16 PM Performed by: Modena Morrow, CRNA, CRNA  Patient location: Pre-op. Lidocaine 1% used for infiltration radial was placed Catheter size: 20 G Hand hygiene performed  and maximum sterile barriers used   Attempts: 1 Procedure performed without using ultrasound guided technique. Following insertion, dressing applied and Biopatch. Post procedure assessment: normal  Additional procedure comments: Ulla Potash, SRNA placed Aline. Marland Kitchen

## 2018-08-15 NOTE — Transfer of Care (Signed)
Immediate Anesthesia Transfer of Care Note  Patient: HARTLEY WYKE  Procedure(s) Performed: TRANSCATHETER AORTIC VALVE REPLACEMENT, TRANSFEMORAL (N/A Chest) TRANSESOPHAGEAL ECHOCARDIOGRAM (TEE) (N/A )  Patient Location: Cath Lab  Anesthesia Type:MAC  Level of Consciousness: drowsy and patient cooperative  Airway & Oxygen Therapy: Patient Spontanous Breathing and Patient connected to nasal cannula oxygen  Post-op Assessment: Report given to RN, Post -op Vital signs reviewed and stable and Patient moving all extremities  Post vital signs: Reviewed and stable  Last Vitals:  Vitals Value Taken Time  BP 102/38 08/15/2018  5:34 PM  Temp 36.4 C 08/15/2018  5:30 PM  Pulse 50 08/15/2018  5:38 PM  Resp 14 08/15/2018  5:38 PM  SpO2 92 % 08/15/2018  5:38 PM  Vitals shown include unvalidated device data.  Last Pain:  Vitals:   08/15/18 1132  TempSrc:   PainSc: 0-No pain         Complications: No apparent anesthesia complications

## 2018-08-15 NOTE — Anesthesia Preprocedure Evaluation (Signed)
Anesthesia Evaluation  Patient identified by MRN, date of birth, ID band Patient awake    Reviewed: Allergy & Precautions, NPO status , Patient's Chart, lab work & pertinent test results, reviewed documented beta blocker date and time   Airway Mallampati: II  TM Distance: >3 FB     Dental   Pulmonary shortness of breath,    breath sounds clear to auscultation       Cardiovascular hypertension, Pt. on medications and Pt. on home beta blockers +CHF  + Valvular Problems/Murmurs AS  Rhythm:Regular Rate:Normal + Systolic murmurs    Neuro/Psych negative neurological ROS     GI/Hepatic negative GI ROS, Neg liver ROS,   Endo/Other  negative endocrine ROS  Renal/GU Renal disease     Musculoskeletal  (+) Arthritis ,   Abdominal   Peds  Hematology  (+) anemia ,   Anesthesia Other Findings   Reproductive/Obstetrics                             Lab Results  Component Value Date   WBC 6.3 08/11/2018   HGB 10.9 (L) 08/11/2018   HCT 35.2 (L) 08/11/2018   MCV 95.7 08/11/2018   PLT 180 08/11/2018   Lab Results  Component Value Date   CREATININE 1.02 (H) 08/11/2018   BUN 26 (H) 08/11/2018   NA 139 08/11/2018   K 4.4 08/11/2018   CL 108 08/11/2018   CO2 20 (L) 08/11/2018    Anesthesia Physical Anesthesia Plan  ASA: IV  Anesthesia Plan: MAC   Post-op Pain Management:    Induction: Intravenous  PONV Risk Score and Plan: 2 and Dexamethasone, Ondansetron and Treatment may vary due to age or medical condition  Airway Management Planned: Natural Airway and Simple Face Mask  Additional Equipment:   Intra-op Plan:   Post-operative Plan:   Informed Consent: I have reviewed the patients History and Physical, chart, labs and discussed the procedure including the risks, benefits and alternatives for the proposed anesthesia with the patient or authorized representative who has indicated his/her  understanding and acceptance.       Plan Discussed with: CRNA  Anesthesia Plan Comments:         Anesthesia Quick Evaluation

## 2018-08-15 NOTE — Progress Notes (Signed)
Pharmacy note: vancomycin  85 yo female s/p TAVR to receive post-op levaquin and vancomycin x1 -SCr= 0.9, CrCl ~ 30 -vancomycin 1250mg  IV given at 4pm  Plan -With age and weight the 1250mg  dose of vancomycin will cover for 24 hours. No further vancomycin is needed -Will reduce levaquin to 500mg  IV x1  Harland German, PharmD Clinical Pharmacist **Pharmacist phone directory can now be found on [email or web address] ([email or web address]).  Listed under [facility].

## 2018-08-15 NOTE — Interval H&P Note (Signed)
History and Physical Interval Note:  08/15/2018 1:50 PM  Natalie Sherman  has presented today for surgery, with the diagnosis of Severe Aortic Stenosis  The various methods of treatment have been discussed with the patient and family. After consideration of risks, benefits and other options for treatment, the patient has consented to  Procedure(s): TRANSCATHETER AORTIC VALVE REPLACEMENT, TRANSFEMORAL (N/A) TRANSESOPHAGEAL ECHOCARDIOGRAM (TEE) (N/A) as a surgical intervention .  The patient's history has been reviewed, patient examined, no change in status, stable for surgery.  I have reviewed the patient's chart and labs.  Questions were answered to the patient's satisfaction.     [name]

## 2018-08-15 NOTE — Anesthesia Procedure Notes (Signed)
Central Venous Catheter Insertion Performed by: Marcene Duos, MD, anesthesiologist Start/End2/25/2020 1:38 PM, 08/15/2018 1:45 PM Patient location: Pre-op. Preanesthetic checklist: patient identified, IV checked, site marked, risks and benefits discussed, surgical consent, monitors and equipment checked, pre-op evaluation, timeout performed and anesthesia consent Position: Trendelenburg Lidocaine 1% used for infiltration and patient sedated Hand hygiene performed , maximum sterile barriers used  and Seldinger technique used Catheter size: 8 Fr Total catheter length 16. Central line was placed.Double lumen Procedure performed using ultrasound guided technique. Ultrasound Notes:anatomy identified, needle tip was noted to be adjacent to the nerve/plexus identified, no ultrasound evidence of intravascular and/or intraneural injection and image(s) printed for medical record Attempts: 1 Following insertion, dressing applied, line sutured and Biopatch. Post procedure assessment: blood return through all ports  Patient tolerated the procedure well with no immediate complications.

## 2018-08-15 NOTE — Op Note (Signed)
[facility]   MULTIDISCIPLINARY HEART VALVE TEAM   TAVR OPERATIVE NOTE   Date of Procedure:  08/15/2018  Preoperative Diagnosis: Severe Aortic Stenosis   Postoperative Diagnosis: Same   Procedure:    Transcatheter Aortic Valve Replacement - Percutaneous Right Transfemoral Approach  Edwards Sapien 3 THV (size 26 mm, model # 9600TFX, serial # [id number])   Co-Surgeons:   [name], MD and Tonny Bollman, MD   Anesthesiologist:  Hyman Bower, MD  Echocardiographer:  Charlton Haws, MD  Pre-operative Echo Findings:  Severe aortic stenosis  Normal left ventricular systolic function  Post-operative Echo Findings:  No paravalvular leak  Normal left ventricular systolic function   BRIEF CLINICAL NOTE AND INDICATIONS FOR SURGERY   This 85 year old woman has stage D, severe, symptomatic aortic stenosis with recent hospitalization for acute diastolic congestive heart failure with pulmonary edema. She improved with diuresis and now has New York Heart Association class II symptoms of exertional shortness of breath. She has not been very active since her admission in December. I have personally reviewed her 2D echocardiogram, cardiac catheterization, and CTA studies. Her echocardiogram shows a trileaflet aortic valve with heavily calcified leaflets and restricted mobility. The mean gradient is 40 mmHg with a peak gradient of 98 mmHg consistent with severe aortic stenosis. Left ventricular systolic function is normal. Cardiac catheterization shows calcified coronary arteries with minor nonobstructive coronary disease. I agree that aortic valve replacement is indicated in this patient to prevent recurrent episodes of acute diastolic congestive heart failure and progressive left ventricular deterioration. I think transcatheter aortic valve replacement would be the best option for her given her advanced age. Her gated cardiac CTA shows anatomy suitable for  transcatheter aortic valve replacement using a 26 mm Sapien 3 valve.There is greater than 75% circumferential calcification of the sinotubular junction which was measured at 26 mm in diameter on the CTA report and on our remeasurement was 28 mm. She also has bulky nodular calcification of the base of the non-and right coronary cusps which may increase the risk of perivalvular leak but I do not think are prohibitive. Her abdominal and pelvic CTA shows adequate pelvic vascular anatomy to allow transfemoral insertion.  The patientand her daughter and son-in-law werecounseled at length regarding treatment alternatives for management of severe symptomatic aortic stenosis. The risks and benefits of surgical intervention has been discussed in detail. Long-term prognosis with medical therapy was discussed. Alternative approaches such as conventional surgical aortic valve replacement, transcatheter aortic valve replacement, and palliative medical therapy were compared and contrasted at length. This discussion was placed in the context of the patient's own specific clinical presentation and past medical history. All of their questions havebeen addressed.   Following the decision to proceed with transcatheter aortic valve replacement, a discussion was held regarding what types of management strategies would be attempted intraoperatively in the event of life-threatening complications, including whether or not the patient would be considered a candidate for the use of cardiopulmonary bypass and/or conversion to open sternotomy for attempted surgical intervention. The patient is aware of the fact that transient use of cardiopulmonary bypass may be necessary.Given her age of 85 years I do not think she would be a candidate for emergent median sternotomy for any intraoperative complications.  The patient has been advised of a variety of complications that might develop including but not limited to risks of death,  stroke, paravalvular leak, aortic dissection or other major vascular complications, aortic annulus rupture, device embolization, cardiac rupture or perforation,  mitral regurgitation, acute myocardial infarction, arrhythmia, heart block or bradycardia requiring permanent pacemaker placement, congestive heart failure, respiratory failure, renal failure, pneumonia, infection, other late complications related to structural valve deterioration or migration, or other complications that might ultimately cause a temporary or permanent loss of functional independence or other long term morbidity. The patient provides full informed consent for the procedure as described and all questions were answered.    DETAILS OF THE OPERATIVE PROCEDURE  PREPARATION:    The patient is brought to the operating room on the above mentioned date and central monitoring was established by the anesthesia team including placement of a central venous line and radial arterial line. The patient is placed in the supine position on the operating table.  Intravenous antibiotics are administered. The patient is monitored closely throughout the procedure under conscious sedation.  Baseline transthoracic echocardiogram was performed. The patient's chest, abdomen, both groins, and both lower extremities are prepared and draped in a sterile manner. A time out procedure is performed.   PERIPHERAL ACCESS:    Using the modified Seldinger technique, femoral arterial and venous access was obtained with placement of 6 Fr sheaths on the left side.  A pigtail diagnostic catheter was passed through the left arterial sheath under fluoroscopic guidance into the aortic root.  A temporary transvenous pacemaker catheter was passed through the left femoral venous sheath under fluoroscopic guidance into the right ventricle.  The pacemaker was tested to ensure stable lead placement and pacemaker capture. Aortic root angiography was performed in order to  determine the optimal angiographic angle for valve deployment.   TRANSFEMORAL ACCESS:   Percutaneous transfemoral access and sheath placement was performed using ultrasound guidance.  The right common femoral artery was cannulated using a micropuncture needle and appropriate location was verified using hand injection angiogram.  A pair of Abbott Perclose percutaneous closure devices were placed and a 6 French sheath replaced into the femoral artery.  The patient was heparinized systemically and ACT verified > 250 seconds.    A  14 Fr transfemoral E-sheath was introduced into the right femoral artery after progressively dilating over an Amplatz superstiff wire. An AL-1 catheter was used to direct a straight-tip exchange length wire across the native aortic valve into the left ventricle. This was exchanged out for a pigtail catheter and position was confirmed in the LV apex. Simultaneous LV and Ao pressures were recorded.  The pigtail catheter was exchanged for an Amplatz Extra-stiff wire in the LV apex.      BALLOON AORTIC VALVULOPLASTY:   Not performed.  TRANSCATHETER HEART VALVE DEPLOYMENT:   An Edwards Sapien 3 transcatheter heart valve (size 26 mm, model #9600TFX, serial #[id number]) was prepared and crimped per manufacturer's guidelines, and the proper orientation of the valve is confirmed on the [employer] delivery system. The valve was advanced through the introducer sheath using normal technique until in an appropriate position in the abdominal aorta beyond the sheath tip. The balloon was then retracted and using the fine-tuning wheel was centered on the valve. The valve was then advanced across the aortic arch using appropriate flexion of the catheter. The valve was carefully positioned across the aortic valve annulus. The Commander catheter was retracted using normal technique. Once final position of the valve has been confirmed by angiographic assessment, the valve is deployed while  temporarily holding ventilation and during rapid ventricular pacing to maintain systolic blood pressure < 50 mmHg and pulse pressure < 10 mmHg. The balloon inflation is held for >3  seconds after reaching full deployment volume. Once the balloon has fully deflated the balloon is retracted into the ascending aorta and valve function is assessed using echocardiography. There is felt to be no paravalvular leak and no central aortic insufficiency.  The patient's hemodynamic recovery following valve deployment is good.  The deployment balloon and guidewire are both removed.    PROCEDURE COMPLETION:   The sheath was removed and femoral artery closure performed using the previously placed Perclose devices.  Protamine was administered once femoral arterial repair was complete. The temporary pacemaker, pigtail catheters and left femoral venous sheath were removed with manual pressure used for hemostasis. A Mynx closure device was used for left femoral arterial hemostasis.  The patient tolerated the procedure well and is transported to the surgical intensive care in stable condition. There were no immediate intraoperative complications. All sponge instrument and needle counts are verified correct at completion of the operation.   No blood products were administered during the operation.  The patient received a total of 40 mL of intravenous contrast during the procedure.   [name], MD 08/15/2018 5:33 PM

## 2018-08-15 NOTE — Progress Notes (Signed)
  [facility]   MULTIDISCIPLINARY HEART VALVE TEAM  Patient doing well s/p TAVR. She is hemodynamically stable. Groin sites stable. ECG not completed yet, but HR stable on tele. If she remains stable, plan to DC arterial line and transfer to 4E. Early ambulation after bedrest completed and hopeful discharge over the next 24-48 hours.   Cline Crock PA-C  [facility]  Pager [phone]

## 2018-08-15 NOTE — Op Note (Signed)
[facility]   MULTIDISCIPLINARY HEART VALVE TEAM   TAVR OPERATIVE NOTE   Date of Procedure:  08/15/2018  Preoperative Diagnosis: Severe Aortic Stenosis   Postoperative Diagnosis: Same   Procedure:    Transcatheter Aortic Valve Replacement - Percutaneous  Transfemoral Approach  Edwards Sapien 3 THV (size 26 mm, model # 9600TFX, serial # [id number])   Co-Surgeons:  [name], MD and Tonny Bollman, MD  Anesthesiologist:  Karna Christmas, MD  Echocardiographer:  Charlton Haws, MD  Pre-operative Echo Findings:  Severe aortic stenosis  Normal left ventricular systolic function  Post-operative Echo Findings:  No paravalvular leak  Normal left ventricular systolic function  BRIEF CLINICAL NOTE AND INDICATIONS FOR SURGERY  Please see the complete note of Dr. Laneta Simmers for the patient's history and surgical indications.  DETAILS OF THE OPERATIVE PROCEDURE  PREPARATION:   The patient is brought to the operating room on the above mentioned date and central monitoring was established by the anesthesia team including placement of a central venous catheter and radial arterial line. The patient is placed in the supine position on the operating table.  Intravenous antibiotics are administered. The patient is monitored closely throughout the procedure under conscious sedation.  Baseline transthoracic echocardiogram is performed. The patient's chest, abdomen, both groins, and both lower extremities are prepared and draped in a sterile manner. A time out procedure is performed.   PERIPHERAL ACCESS:   Using ultrasound guidance, femoral arterial and venous access is obtained with placement of 6 Fr sheaths on the left side.  A pigtail diagnostic catheter was passed through the femoral arterial sheath under fluoroscopic guidance into the aortic root.  A temporary transvenous pacemaker catheter was passed through the femoral venous sheath under fluoroscopic guidance into the  right ventricle.  The pacemaker was tested to ensure stable lead placement and pacemaker capture. Aortic root angiography was performed in order to determine the optimal angiographic angle for valve deployment.  TRANSFEMORAL ACCESS:  A micropuncture technique is used to access the right femoral artery under fluoroscopic and ultrasound guidance.  2 Perclose devices are deployed at 10' and 2' positions to 'PreClose' the femoral artery. An 8 French sheath is placed and then an Amplatz Superstiff wire is advanced through the sheath. This is changed out for a 14 French transfemoral E-Sheath after progressively dilating over the Superstiff wire.  An AL-1 catheter was used to direct a straight-tip exchange length wire across the native aortic valve into the left ventricle. This was exchanged out for a pigtail catheter and position was confirmed in the LV apex. Simultaneous LV and Ao pressures were recorded.  The pigtail catheter was exchanged for an Amplatz Extra-stiff wire in the LV apex.    BALLOON AORTIC VALVULOPLASTY:  Not performed  TRANSCATHETER HEART VALVE DEPLOYMENT:  An Edwards Sapien 3 transcatheter heart valve (size 26 mm, model #9600TFX, serial #[id number]) was prepared and crimped per manufacturer's guidelines, and the proper orientation of the valve is confirmed on the [employer] delivery system. The valve was advanced through the introducer sheath using normal technique until in an appropriate position in the abdominal aorta beyond the sheath tip. The balloon was then retracted and using the fine-tuning wheel was centered on the valve. The valve was then advanced across the aortic arch using appropriate flexion of the catheter. The valve was carefully positioned across the aortic valve annulus. The Commander catheter was retracted using normal technique. Once final position of the valve has been confirmed by angiographic assessment, the  valve is deployed while temporarily holding ventilation and  during rapid ventricular pacing to maintain systolic blood pressure < 50 mmHg and pulse pressure < 10 mmHg. The balloon inflation is held for >3 seconds after reaching full deployment volume. Once the balloon has fully deflated the balloon is retracted into the ascending aorta and valve function is assessed using echocardiography. There is felt to be no paravalvular leak and no central aortic insufficiency.  The patient's hemodynamic recovery following valve deployment is good.  The deployment balloon and guidewire are both removed. Echo demostrated acceptable post-procedural gradients, stable mitral valve function, and no aortic insufficiency.   PROCEDURE COMPLETION:  The sheath was removed and femoral artery closure is performed using the 2 previously deployed Perclose devices.  Protamine is administered once femoral arterial repair was complete. The site is clear with no evidence of bleeding or hematoma after the sutures are tightened. The temporary pacemaker, pigtail catheters and femoral sheaths were removed with manual pressure used for hemostasis.   The patient tolerated the procedure well and is transported to the surgical intensive care in stable condition. There were no immediate intraoperative complications. All sponge instrument and needle counts are verified correct at completion of the operation.   The patient received a total of 40 mL of intravenous contrast during the procedure.   Tonny Bollman, MD 08/15/2018 6:11 PM

## 2018-08-15 NOTE — Progress Notes (Signed)
Patient reports numbness to right index, ring, and middle finger on same side as A- line. Dr. Sampson Goon, R made aware of same.

## 2018-08-15 NOTE — Anesthesia Postprocedure Evaluation (Signed)
Anesthesia Post Note  Patient: Natalie Sherman  Procedure(s) Performed: TRANSCATHETER AORTIC VALVE REPLACEMENT, TRANSFEMORAL (N/A Chest) TRANSESOPHAGEAL ECHOCARDIOGRAM (TEE) (N/A )     Patient location during evaluation: Cath Lab Anesthesia Type: MAC Level of consciousness: awake Pain management: pain level controlled Vital Signs Assessment: post-procedure vital signs reviewed and stable Respiratory status: spontaneous breathing, nonlabored ventilation, respiratory function stable and patient connected to nasal cannula oxygen Cardiovascular status: stable and blood pressure returned to baseline Postop Assessment: no apparent nausea or vomiting Anesthetic complications: no    Last Vitals:  Vitals:   08/15/18 1925 08/15/18 1951  BP: (!) 144/41 (!) 137/49  Pulse: (!) 49 (!) 48  Resp: 16 13  Temp:  (!) 36.4 C  SpO2: 91% 93%    Last Pain:  Vitals:   08/15/18 1951  TempSrc: Oral  PainSc:                  Bartholomew Ramesh P Perrin Gens

## 2018-08-16 ENCOUNTER — Encounter (HOSPITAL_COMMUNITY): Payer: Self-pay | Admitting: Cardiovascular Disease

## 2018-08-16 ENCOUNTER — Inpatient Hospital Stay (HOSPITAL_COMMUNITY): Payer: Medicare Other

## 2018-08-16 DIAGNOSIS — I350 Nonrheumatic aortic (valve) stenosis: Principal | ICD-10-CM

## 2018-08-16 DIAGNOSIS — Z952 Presence of prosthetic heart valve: Secondary | ICD-10-CM

## 2018-08-16 LAB — CBC
HCT: 29.5 % — ABNORMAL LOW (ref 36.0–46.0)
Hemoglobin: 9 g/dL — ABNORMAL LOW (ref 12.0–15.0)
MCH: 29.2 pg (ref 26.0–34.0)
MCHC: 30.5 g/dL (ref 30.0–36.0)
MCV: 95.8 fL (ref 80.0–100.0)
Platelets: 151 10*3/uL (ref 150–400)
RBC: 3.08 MIL/uL — ABNORMAL LOW (ref 3.87–5.11)
RDW: 14.2 % (ref 11.5–15.5)
WBC: 5.2 10*3/uL (ref 4.0–10.5)
nRBC: 0 % (ref 0.0–0.2)

## 2018-08-16 LAB — BASIC METABOLIC PANEL
Anion gap: 6 (ref 5–15)
BUN: 18 mg/dL (ref 8–23)
CO2: 26 mmol/L (ref 22–32)
Calcium: 8.4 mg/dL — ABNORMAL LOW (ref 8.9–10.3)
Chloride: 105 mmol/L (ref 98–111)
Creatinine, Ser: 1.11 mg/dL — ABNORMAL HIGH (ref 0.44–1.00)
GFR calc Af Amer: 49 mL/min — ABNORMAL LOW (ref >60)
GFR calc non Af Amer: 42 mL/min — ABNORMAL LOW (ref >60)
Glucose, Bld: 127 mg/dL — ABNORMAL HIGH (ref 70–99)
Potassium: 4.6 mmol/L (ref 3.5–5.1)
Sodium: 137 mmol/L (ref 135–145)

## 2018-08-16 LAB — ECHOCARDIOGRAM LIMITED
Height: 63 in
Weight: 2151.69 oz

## 2018-08-16 LAB — MAGNESIUM: Magnesium: 2 mg/dL (ref 1.7–2.4)

## 2018-08-16 MED ORDER — FUROSEMIDE 20 MG PO TABS
20.0000 mg | ORAL_TABLET | ORAL | Status: DC
  Administered 2018-08-16: 20 mg via ORAL
  Filled 2018-08-16 (×2): qty 1

## 2018-08-16 MED ORDER — POTASSIUM CHLORIDE ER 10 MEQ PO TBCR
10.0000 meq | EXTENDED_RELEASE_TABLET | ORAL | Status: DC
  Administered 2018-08-16: 10 meq via ORAL
  Filled 2018-08-16 (×2): qty 1

## 2018-08-16 MED FILL — Magnesium Sulfate Inj 50%: INTRAMUSCULAR | Qty: 10 | Status: AC

## 2018-08-16 MED FILL — Potassium Chloride Inj 2 mEq/ML: INTRAVENOUS | Qty: 40 | Status: AC

## 2018-08-16 MED FILL — Heparin Sodium (Porcine) Inj 1000 Unit/ML: INTRAMUSCULAR | Qty: 30 | Status: AC

## 2018-08-16 NOTE — Discharge Instructions (Signed)

## 2018-08-16 NOTE — Progress Notes (Signed)
  Echocardiogram 2D Echocardiogram has been performed.  Celene Skeen 08/16/2018, 2:35 PM

## 2018-08-16 NOTE — Progress Notes (Addendum)
[facility]   MULTIDISCIPLINARY HEART VALVE TEAM  Patient Name: Natalie Sherman Date of Encounter: 08/16/2018  Primary Cardiologist: Dr. Mariah Milling / Dr. Excell Seltzer & Dr. Laneta Simmers (TAVR)  Hospital Problem List     Principal Problem:   S/P TAVR (transcatheter aortic valve replacement) Active Problems:   Acute on chronic diastolic heart failure (HCC)   HTN (hypertension)   Severe aortic stenosis   Hyperlipidemia   CKD (chronic kidney disease) stage 3, GFR 30-59 ml/min (HCC)     Subjective   Feeling well with no complaints. No chest pain or SOB. Did well working with cardiac rehab.  Inpatient Medications    Scheduled Meds: . aspirin EC  81 mg Oral Daily  . clopidogrel  75 mg Oral Q breakfast  . furosemide  20 mg Oral QODAY  . potassium chloride  10 mEq Oral QODAY  . simvastatin  20 mg Oral QPM  . sodium chloride flush  3 mL Intravenous Q12H   Continuous Infusions: . sodium chloride    . levofloxacin (LEVAQUIN) IV 500 mg (08/16/18 1035)  . nitroGLYCERIN    . phenylephrine (NEO-SYNEPHRINE) Adult infusion     PRN Meds: sodium chloride, acetaminophen **OR** acetaminophen, metoprolol tartrate, morphine injection, ondansetron (ZOFRAN) IV, oxyCODONE, sodium chloride flush, traMADol   Vital Signs    Vitals:   08/15/18 1951 08/15/18 2316 08/16/18 0427 08/16/18 0806  BP: (!) 137/49 (!) 146/59 (!) 132/47 (!) 137/51  Pulse: (!) 48 61 (!) 52 (!) 54  Resp: 13 16 13 15   Temp: (!) 97.5 F (36.4 C) (!) 97.5 F (36.4 C) 97.6 F (36.4 C) 97.6 F (36.4 C)  TempSrc: Oral Oral Oral   SpO2: 93% 95% 96% 98%  Weight: 61.5 kg  61 kg   Height:        Intake/Output Summary (Last 24 hours) at 08/16/2018 1133 Last data filed at 08/16/2018 0902 Gross per 24 hour  Intake 1400 ml  Output -  Net 1400 ml   Filed Weights   08/15/18 1109 08/15/18 1951 08/16/18 0427  Weight: 61.2 kg 61.5 kg 61 kg    Physical Exam   GEN: Well nourished, well developed, in no acute distress.  Appears younger than stated age HEENT: Grossly normal.  Neck: Supple, no JVD, carotid bruits, or masses. Cardiac: RRR, no murmurs, rubs, or gallops. No clubbing, cyanosis, edema.  Radials/DP/PT 2+ and equal bilaterally.  Respiratory:  Respirations regular and unlabored, clear to auscultation bilaterally. GI: Soft, nontender, nondistended, BS + x 4. MS: no deformity or atrophy. Skin: warm and dry, no rash. Groin sites without ecchymosis or hematoma Neuro:  Strength and sensation are intact. Psych: AAOx3.  Normal affect.  Labs    CBC Recent Labs    08/15/18 1754 08/16/18 0431  WBC  --  5.2  HGB 8.5* 9.0*  HCT 25.0* 29.5*  MCV  --  95.8  PLT  --  151   Basic Metabolic Panel Recent Labs    01/77/93 1714 08/15/18 1754 08/15/18 1755 08/16/18 0431  NA 141 140  --  137  K 4.2 4.2  --  4.6  CL  --   --   --  105  CO2  --   --   --  26  GLUCOSE 103*  --   --  127*  BUN  --   --   --  18  CREATININE  --   --  0.90 1.11*  CALCIUM  --   --   --  8.4*  MG  --   --   --  2.0   Liver Function Tests No results for input(s): AST, ALT, ALKPHOS, BILITOT, PROT, ALBUMIN in the last 72 hours. No results for input(s): LIPASE, AMYLASE in the last 72 hours. Cardiac Enzymes No results for input(s): CKTOTAL, CKMB, CKMBINDEX, TROPONINI in the last 72 hours. BNP Invalid input(s): POCBNP D-Dimer No results for input(s): DDIMER in the last 72 hours. Hemoglobin A1C No results for input(s): HGBA1C in the last 72 hours. Fasting Lipid Panel No results for input(s): CHOL, HDL, LDLCALC, TRIG, CHOLHDL, LDLDIRECT in the last 72 hours. Thyroid Function Tests No results for input(s): TSH, T4TOTAL, T3FREE, THYROIDAB in the last 72 hours.  Invalid input(s): FREET3  Telemetry    Sinus brady HR in 50-60s - Personally Reviewed  ECG    Sinus brady - Personally Reviewed  Radiology    Dg Chest Port 1 View  Result Date: 08/15/2018 CLINICAL DATA:  Postop day 0 TAVR. EXAM: PORTABLE CHEST 1 VIEW  COMPARISON:  08/11/2018 and earlier FINDINGS: TAVR with the metallic strut oriented in the expected location of the aortic valve. RIGHT jugular central venous catheter tip projects at or near the cavoatrial junction. Cardiac silhouette mildly enlarged, unchanged. Mild bibasilar atelectasis. Lungs otherwise clear. Pulmonary vascularity normal. IMPRESSION: 1. Support apparatus satisfactory. 2. Mild bibasilar atelectasis. No acute cardiopulmonary disease otherwise. Electronically Signed   By: Hulan Saas M.D.   On: 08/15/2018 20:31    Cardiac Studies   TAVR OPERATIVE NOTE   Date of Procedure:                08/15/2018  Preoperative Diagnosis:      Severe Aortic Stenosis   Postoperative Diagnosis:    Same   Procedure:        Transcatheter Aortic Valve Replacement - Percutaneous Right Transfemoral Approach             Edwards Sapien 3 THV (size 26 mm, model # 9600TFX, serial # [id number])              Co-Surgeons:                         [name], MD and Tonny Bollman, MD   Anesthesiologist:                  Hyman Bower, MD  Echocardiographer:              Charlton Haws, MD  Pre-operative Echo Findings: ? Severe aortic stenosis ? Normal left ventricular systolic function  Post-operative Echo Findings: ? No paravalvular leak ? Normal left ventricular systolic function   ______________   Echo 08/16/18: pending   Patient Profile     Natalie Sherman is a 85 y.o. female with a history of mod MR, CKD stage III, HTN, HLD, chronic diastolic CHF with recent admission and severe AS who presented to [facility] on 08/15/18 for planned TAVR.   Assessment & Plan    Severe AS: s/p successful TAVR with a 26 mm Edwards Sapien 3 THV via the TF approach on 08/15/18. Post operative echo pending. Groin sites are stable. ECG with sinus brady but no high grade heart block. Continue Asprin and plavix.   HTN: BP mildly elevated. Home atenolol 50mg  on hold. Hesitant to add this back  with HRs in 50s and 60s at baseline although review of old office notes reveal HRs in 50-60s chronically. Resume home lasix 20mg   QOD now.   Acute on chronic diastolic CHF: as evidenced by elevated BNP ~1000 and CXR with mild pleural effusions on pre op testing. This has been treated with TAVR. Plan to resume home lasix as above   CKD stage III: creat around her baseline ~1.11  Signed, Cline Crock, PA-C  08/16/2018, 11:33 AM  Pager [phone]  Patient seen, examined. Available data reviewed. Agree with findings, assessment, and plan as outlined by Carlean Jews, PA-C.  The patient is independently interviewed and examined.  She is an elderly woman in no distress.  JVP is normal, lungs are clear, heart is regular rate and rhythm with a grade 2/6 ejection murmur at the right upper sternal border, abdomen is soft and nontender, bilateral groin sites are clear, there is no pretibial edema.  The patient's postoperative day #1 echocardiogram is pending.  She has done very well with TAVR.  Telemetry is reviewed and shows normal sinus rhythm with no significant arrhythmia.  She will be mobilized today with plans to discharge her home tomorrow as long as she remains clinically stable.  Tonny Bollman, M.D. 08/16/2018 1:52 PM

## 2018-08-16 NOTE — Progress Notes (Signed)
CARDIAC REHAB PHASE I   PRE:  Rate/Rhythm: 67 SR    BP: sitting 132/53    SaO2: 99 RA  MODE:  Ambulation: 410 ft   POST:  Rate/Rhythm: 107 ST    BP: sitting 158/58     SaO2: 94 RA  Pt eager to walk. Able to get up independently and walk with RW (she uses at home "when needed"). Pt ambulated with quick pace. No SOB, felt well. To recliner. Discussed restrictions and walking at home. She is not interested in CRPII as she does not drive. Encouraged more walking today. [phone]   Harriet Masson CES, ACSM 08/16/2018 10:19 AM

## 2018-08-16 NOTE — Progress Notes (Signed)
Right IJ removed at 1104 am without difficult. Patient supine in bed for removal.  Pressure held for 5 mins x 2.   Patient bedrest initiated at 11:04am Marcelino Duster, [place]

## 2018-08-17 ENCOUNTER — Other Ambulatory Visit: Payer: Self-pay | Admitting: Physician Assistant

## 2018-08-17 DIAGNOSIS — I5033 Acute on chronic diastolic (congestive) heart failure: Secondary | ICD-10-CM

## 2018-08-17 DIAGNOSIS — Z952 Presence of prosthetic heart valve: Secondary | ICD-10-CM

## 2018-08-17 LAB — BASIC METABOLIC PANEL
Anion gap: 8 (ref 5–15)
BUN: 25 mg/dL — ABNORMAL HIGH (ref 8–23)
CO2: 27 mmol/L (ref 22–32)
Calcium: 8.6 mg/dL — ABNORMAL LOW (ref 8.9–10.3)
Chloride: 102 mmol/L (ref 98–111)
Creatinine, Ser: 1.3 mg/dL — ABNORMAL HIGH (ref 0.44–1.00)
GFR calc Af Amer: 40 mL/min — ABNORMAL LOW (ref >60)
GFR calc non Af Amer: 35 mL/min — ABNORMAL LOW (ref >60)
Glucose, Bld: 120 mg/dL — ABNORMAL HIGH (ref 70–99)
Potassium: 4 mmol/L (ref 3.5–5.1)
Sodium: 137 mmol/L (ref 135–145)

## 2018-08-17 LAB — CBC
HCT: 28.4 % — ABNORMAL LOW (ref 36.0–46.0)
Hemoglobin: 9.1 g/dL — ABNORMAL LOW (ref 12.0–15.0)
MCH: 29.8 pg (ref 26.0–34.0)
MCHC: 32 g/dL (ref 30.0–36.0)
MCV: 93.1 fL (ref 80.0–100.0)
Platelets: 148 K/uL — ABNORMAL LOW (ref 150–400)
RBC: 3.05 MIL/uL — ABNORMAL LOW (ref 3.87–5.11)
RDW: 14.6 % (ref 11.5–15.5)
WBC: 6.3 K/uL (ref 4.0–10.5)
nRBC: 0 % (ref 0.0–0.2)

## 2018-08-17 MED ORDER — CLOPIDOGREL BISULFATE 75 MG PO TABS: 75.0000 mg | ORAL_TABLET | Freq: Every day | ORAL | 1 refills | Status: DC

## 2018-08-17 NOTE — Progress Notes (Signed)
CARDIAC REHAB PHASE I   PRE:  Rate/Rhythm: 72 SR with PACs    BP: sitting 166/67    SaO2:   MODE:  Ambulation: 470 ft   POST:  Rate/Rhythm: 114 ST with PACs    BP: sitting 205/72, recheck 187/72     SaO2: 94 RA  Pt tolerated well with RW, independent, quick pace. No c/o. BP quite elevated after walk. Made RN aware, she will check if any PRN BP meds.  [id number]   Harriet Masson CES, ACSM 08/17/2018 10:16 AM

## 2018-08-17 NOTE — Care Management Note (Signed)
Case Management Note Donn Pierini RN, BSN Transitions of Care Unit 4E- RN Case Manager [phone]  Patient Details  Name: Natalie Sherman MRN: [id number] Date of Birth: 26-Oct-1921  Subjective/Objective:  Pt admitted s/p TAVR                  Action/Plan: PTA pt lived at home alone, has family to assist post discharge. No CM needs noted for transition home.   Expected Discharge Date:  08/17/18               Expected Discharge Plan:  Home/Self Care  In-House Referral:  NA  Discharge planning Services  CM Consult  Post Acute Care Choice:  NA Choice offered to:  NA  DME Arranged:    DME Agency:     HH Arranged:    HH Agency:     Status of Service:  Completed, signed off  If discussed at Long Length of Stay Meetings, dates discussed:    Discharge Disposition: home/self care   Additional Comments:  Darrold Span, RN 08/17/2018, 11:00 AM

## 2018-08-17 NOTE — Progress Notes (Signed)
Discharged home w/ son in law. Paperwork given, prescriptions at the pharmacy. Patient and son in law verbalized understanding.

## 2018-08-17 NOTE — Discharge Summary (Addendum)
[facility]   MULTIDISCIPLINARY HEART VALVE TEAM  Discharge Summary    Patient ID: Natalie Sherman MRN: [id number]; DOB: [birth year]  Admit date: 08/15/2018 Discharge date: 08/17/2018  Primary Care Provider: Lupita Raider, MD  Primary Cardiologist: Dr. Mariah Milling / Dr. Excell Seltzer & Dr. Laneta Simmers (TAVR)  Discharge Diagnoses    Principal Problem:   S/P TAVR (transcatheter aortic valve replacement) Active Problems:   Acute on chronic diastolic heart failure (HCC)   HTN (hypertension)   Severe aortic stenosis   Hyperlipidemia   CKD (chronic kidney disease) stage 3, GFR 30-59 ml/min (HCC)   Allergies Allergies  Allergen Reactions  . Penicillins Rash    DID THE REACTION INVOLVE: Swelling of the face/tongue/throat, SOB, or low BP? No Sudden or severe rash/hives, skin peeling, or the inside of the mouth or nose? No Did it require medical treatment? No When did it last happen?more than 10 years ago If all above answers are "NO", may proceed with cephalosporin use.     Diagnostic Studies/Procedures    TAVR OPERATIVE NOTE   Date of Procedure:08/15/2018  Preoperative Diagnosis:Severe Aortic Stenosis   Postoperative Diagnosis:Same   Procedure:   Transcatheter Aortic Valve Replacement - PercutaneousRightTransfemoral Approach Edwards Sapien 3 THV (size 26mm, model # 9600TFX, serial # [id number])  Co-Surgeons:Bryan Jennefer Bravo, MD and Tonny Bollman, MD   Anesthesiologist:R. Bradley Ferris, MD  Echocardiographer:Peter Eden Emms, MD  Pre-operative Echo Findings: ? Severe aortic stenosis ? Normalleft ventricular systolic function  Post-operative Echo Findings: ? Noparavalvular leak ? Normalleft ventricular systolic function   ______________   Echo 08/16/18: IMPRESSIONS  1. The left ventricle has normal systolic function, with an ejection fraction  of 60-65%. Left ventricular diastology could not be evaluated due to nondiagnostic images.  2. The mitral valve is normal in structure. Moderate thickening of the mitral valve leaflet. There is severe mitral annular calcification present.  3. The tricuspid valve was normal in structure.  4. S/P 26mm Edwards Sapien 3 THV TAVR which is well seated with normal function. There is a mild perivalvular leak. AV Area (VTI): 1.90 cm, AV Mean Grad: 6.0 mmHg,LVOT/AV VTI ratio: 0.67.  5. The pericardial effusion is posterior to the left ventricle.  6. Trivial pericardial effusion is present.   HPI     Natalie Sherman is a 85 y.o. female with a history of mod MR, CKD stage III, HTN, HLD, anemia, chronic diastolic CHF with recent admission and severe AS who presented to [facility] on 08/15/18 for planned TAVR.   She was in her usual state of excellent health until the morning of 06/16/18 when she woke up with acute dyspnea. She was taken to the emergency room at [facility] by her family and diagnosed with pulmonary edema and acute congestive heart failure. Echocardiogram on 06/17/2018 showed a trileaflet aortic valve with mild calcification and thickening of the leaflets.  The mean gradient across the valve was measured at 40 mmHg with a peak gradient of 98 mmHg.  The dimensionless index was 0.16 and the valve area was measured at 0.52 cm.  Left ventricular ejection fraction was 65%.  There was moderate mitral regurgitation. L/RHC showed calcified coronary arteries with minor nonobstructive CAD.  The patient has been evaluated by the multidisciplinary valve team and felt to have severe, symptomatic aortic stenosis and to be a suitable candidate for TAVR, which was set up for 08/15/18.    Hospital Course     Consultants: none  Severe AS:s/p successful TAVR with a 26 mm Edwards Sapien  3 THV via the TF approach on 08/15/18. Post operative echo showed EF 60%, normally functioning TAVR with mean gradient of 6.0 mm Hg and  mild PVL. Groin sites are stable. ECG with sinus brady and no high grade heart block. Continue Asprin and plavix. She has walked with cardiac rehab and done quite well. Plan for DC home with close follow up in office next week.   HTN: BP has been mildly elevated. HR back in high 60s. Resume home atenolol 50mg  daily   Acute on chronic diastolic CHF: as evidenced by elevated BNP ~1000 and CXR with mild pleural effusions on pre op testing. This has been treated with TAVR. Home lasix was resumed, but creat trending up so I will hold this and resume next week if kidney function back to baseline   CKD stage III: creat trending upward 0.9--> 1.3. Will hold lasix 20mg  QOD at discharge. BMET next week.   Anemia: hg has remained stable around 9 _____________  Discharge Vitals Blood pressure (!) 149/62, pulse 68, temperature (!) 97.5 F (36.4 C), temperature source Oral, resp. rate 18, height 5\' 3"  (1.6 m), weight 60.8 kg, SpO2 98 %.  Filed Weights   08/15/18 1951 08/16/18 0427 08/17/18 0355  Weight: 61.5 kg 61 kg 60.8 kg   VS:  BP (!) 149/62 (BP Location: Right Arm)   Pulse 68   Temp (!) 97.5 F (36.4 C) (Oral)   Resp 18   Ht 5\' 3"  (1.6 m)   Wt 60.8 kg   SpO2 98%   BMI 23.74 kg/m    GEN: Well nourished, well developed, in no acute distress HEENT: normal Neck: no JVD or masses Cardiac: RRR; 2/6 SEM. No rubs, or gallops,no edema  Respiratory:  clear to auscultation bilaterally, normal work of breathing GI: soft, nontender, nondistended, + BS MS: no deformity or atrophy Skin: warm and dry, no rash. Groin sites healing well with no hematoma or ecchymosis Neuro:  Alert and Oriented x 3, Strength and sensation are intact Psych: euthymic mood, full affect   Labs & Radiologic Studies    CBC Recent Labs    08/16/18 0431 08/17/18 0221  WBC 5.2 6.3  HGB 9.0* 9.1*  HCT 29.5* 28.4*  MCV 95.8 93.1  PLT 151 148*   Basic Metabolic Panel Recent Labs    99/24/26 0431 08/17/18 0221    NA 137 137  K 4.6 4.0  CL 105 102  CO2 26 27  GLUCOSE 127* 120*  BUN 18 25*  CREATININE 1.11* 1.30*  CALCIUM 8.4* 8.6*  MG 2.0  --    Liver Function Tests No results for input(s): AST, ALT, ALKPHOS, BILITOT, PROT, ALBUMIN in the last 72 hours. No results for input(s): LIPASE, AMYLASE in the last 72 hours. Cardiac Enzymes No results for input(s): CKTOTAL, CKMB, CKMBINDEX, TROPONINI in the last 72 hours. BNP Invalid input(s): POCBNP D-Dimer No results for input(s): DDIMER in the last 72 hours. Hemoglobin A1C No results for input(s): HGBA1C in the last 72 hours. Fasting Lipid Panel No results for input(s): CHOL, HDL, LDLCALC, TRIG, CHOLHDL, LDLDIRECT in the last 72 hours. Thyroid Function Tests No results for input(s): TSH, T4TOTAL, T3FREE, THYROIDAB in the last 72 hours.  Invalid input(s): FREET3 _____________  Dg Chest 2 View  Result Date: 08/11/2018 CLINICAL DATA:  Preop trans catheter aortic valve replacement history of hypertension EXAM: CHEST - 2 VIEW COMPARISON:  CT 08/08/2018, radiograph 06/16/2018 FINDINGS: Mild basilar fibrosis. No acute consolidation. Trace pleural effusion. Mild cardiomegaly with  aortic atherosclerosis. No pneumothorax. IMPRESSION: 1. Mild cardiomegaly.  Trace pleural effusion. 2. Mild bibasilar fibrosis without acute pulmonary infiltrate. Electronically Signed   By: Jasmine Pang M.D.   On: 08/11/2018 22:41   Ct Coronary Morph W/cta Cor W/score W/ca W/cm &/or Wo/cm  Addendum Date: 08/08/2018   ADDENDUM REPORT: 08/08/2018 11:36 CLINICAL DATA:  Aortic stenosis EXAM: Cardiac TAVR CT TECHNIQUE: The patient was scanned on a Siemens Force 192 slice scanner. A 120 kV retrospective scan was triggered in the descending thoracic aorta at 111 HU's. Gantry rotation speed was 270 msecs and collimation was .9 mm. No beta blockade or nitro were given. The 3D data set was reconstructed in 5% intervals of the R-R cycle. Systolic and diastolic phases were analyzed on a  dedicated work station using MPR, MIP and VRT modes. The patient received 80 cc of contrast. FINDINGS: Aortic Valve: Tri leaflet severely calcified with restricted leaflet motion Aorta: Non aneurysmal normal arch vessels. Severe calcification of the STJ. Nodular calcification at the base of the non and right cusps Sinotubular Junction: 26 mm > 75% circumferential calcification Ascending Thoracic Aorta: 32 mm Aortic Arch: 28 mm Descending Thoracic Aorta: 22 mm Sinus of Valsalva Measurements: Non-coronary: 31 mm Right - coronary: 29.6 mm Left - coronary: 29.8 mm Coronary Artery Height above Annulus: Left Main: 10.2 mm above annulus Right Coronary: 18.8 mm above annulus Virtual Basal Annulus Measurements: Maximum/Minimum Diameter: 28.1 mm x 19.9 mm Perimeter: 77 mm Area: 445 mm2 Coronary Arteries: Sufficient height above annulus for deployment Optimum Fluoroscopic Angle for Delivery: LAO 14 Caudal 14 degrees IMPRESSION: 1. Tri- leaflet AV with annular area of 445 mm2 suitable for a 26 mm Sapien 3 valve 2. Optimum angiographic angle for deployment LAO 14 Caudal 14 degrees 3.  Coronary arteries sufficient height above annulus for deployment 4. Normal aortic root 3.2 cm with > 75% circumferential calcification of the STJ and bulky nodular calcification at the base of the non and right coronary cusps Charlton Haws Electronically Signed   By: Charlton Haws M.D.   On: 08/08/2018 11:36   Result Date: 08/08/2018 EXAM: OVER-READ INTERPRETATION  CT CHEST The following report is an over-read performed by radiologist Dr. Trudie Reed of [facility] on 08/08/2018. This over-read does not include interpretation of cardiac or coronary anatomy or pathology. The coronary calcium score/coronary CTA interpretation by the cardiologist is attached. COMPARISON:  No priors. FINDINGS: Extracardiac findings will be described separately under dictation for contemporaneously obtained CTA chest, abdomen and pelvis. IMPRESSION:  Please see separate dictation for contemporaneously obtained CTA chest, abdomen and pelvis dated 08/08/2018 for full description of relevant extracardiac findings. Electronically Signed: By: Trudie Reed M.D. On: 08/08/2018 11:21   Dg Chest Port 1 View  Result Date: 08/15/2018 CLINICAL DATA:  Postop day 0 TAVR. EXAM: PORTABLE CHEST 1 VIEW COMPARISON:  08/11/2018 and earlier FINDINGS: TAVR with the metallic strut oriented in the expected location of the aortic valve. RIGHT jugular central venous catheter tip projects at or near the cavoatrial junction. Cardiac silhouette mildly enlarged, unchanged. Mild bibasilar atelectasis. Lungs otherwise clear. Pulmonary vascularity normal. IMPRESSION: 1. Support apparatus satisfactory. 2. Mild bibasilar atelectasis. No acute cardiopulmonary disease otherwise. Electronically Signed   By: Hulan Saas M.D.   On: 08/15/2018 20:31   Ct Angio Chest Aorta W &/or Wo Contrast  Result Date: 08/08/2018 CLINICAL DATA:  85 year old female with history of severe aortic stenosis. Preprocedural study prior to potential transcatheter aortic valve replacement (TAVR). EXAM: CT ANGIOGRAPHY CHEST,  ABDOMEN AND PELVIS TECHNIQUE: Multidetector CT imaging through the chest, abdomen and pelvis was performed using the standard protocol during bolus administration of intravenous contrast. Multiplanar reconstructed images and MIPs were obtained and reviewed to evaluate the vascular anatomy. CONTRAST:  ISOVUE-370 IOPAMIDOL (ISOVUE-370) INJECTION 76% COMPARISON:  No priors. FINDINGS: CTA CHEST FINDINGS Cardiovascular: Heart size is normal. There is no significant pericardial fluid, thickening or pericardial calcification. There is aortic atherosclerosis, as well as atherosclerosis of the great vessels of the mediastinum and the coronary arteries, including calcified atherosclerotic plaque in the left main, left anterior descending, left circumflex and right coronary arteries. Severe  thickening and calcification of the aortic valve. Calcifications of the mitral annulus. Mediastinum/Lymph Nodes: No pathologically enlarged mediastinal or hilar lymph nodes. Esophagus is unremarkable in appearance. No axillary lymphadenopathy. Lungs/Pleura: Lung apices are incompletely imaged. With this limitation in mind, there are no definite suspicious appearing pulmonary nodules or masses noted. No acute consolidative airspace disease. No pleural effusions. Scattered areas of mild peripheral predominant septal thickening, most evident throughout the mid to lower lungs. Scattered areas of very mild cylindrical bronchiectasis. Musculoskeletal/Soft Tissues: There are no aggressive appearing lytic or blastic lesions noted in the visualized portions of the skeleton. CTA ABDOMEN AND PELVIS FINDINGS Hepatobiliary: Small calcified granuloma in the liver. No suspicious cystic or solid hepatic lesions. No intra or extrahepatic biliary ductal dilatation. Gallbladder is normal in appearance. Pancreas: No pancreatic mass. No pancreatic ductal dilatation. No pancreatic or peripancreatic fluid or inflammatory changes. Spleen: Unremarkable. Adrenals/Urinary Tract: Low-attenuation lesions in both kidneys, the largest of which is in the interpolar region of the right kidney measuring 1.8 cm in diameter. Mild cortical thinning in both kidneys. No suspicious renal lesions. Bilateral adrenal glands are normal in appearance. No hydroureteronephrosis. Urinary bladder is normal in appearance. Stomach/Bowel: Normal appearance of the stomach. No pathologic dilatation of small bowel or colon. Numerous colonic diverticulae are noted, without surrounding inflammatory changes to suggest an acute diverticulitis at this time. Normal appendix. Vascular/Lymphatic: Aortic atherosclerosis, without evidence of aneurysm or dissection in the abdominal or pelvic vasculature. Vascular findings and measurements pertinent to potential TAVR procedure, as  detailed below. No lymphadenopathy noted in the abdomen or pelvis. Reproductive: Uterus and ovaries are atrophic. Other: No significant volume of ascites.  No pneumoperitoneum. Musculoskeletal: There are no aggressive appearing lytic or blastic lesions noted in the visualized portions of the skeleton. VASCULAR MEASUREMENTS PERTINENT TO TAVR: AORTA: Minimal Aortic Diameter-9 x 9 mm Severity of Aortic Calcification-severe RIGHT PELVIS: Right Common Iliac Artery - Minimal Diameter-6.6 x 6.4 mm Tortuosity-mild Calcification-moderate to severe Right External Iliac Artery - Minimal Diameter-5.8 x 6.0 mm Tortuosity - mild Calcification-none Right Common Femoral Artery - Minimal Diameter-6.2 x 5.2 mm Tortuosity - mild Calcification-mild LEFT PELVIS: Left Common Iliac Artery - Minimal Diameter-7.8 x 7.0 mm Tortuosity - mild Calcification-moderate to severe Left External Iliac Artery - Minimal Diameter-6.1 x 6.3 mm Tortuosity - mild Calcification-none Left Common Femoral Artery - Minimal Diameter-6.6 x 5.8 mm Tortuosity - mild Calcification-mild Review of the MIP images confirms the above findings. IMPRESSION: 1. Vascular findings and measurements pertinent to potential TAVR procedure, as detailed above. 2. Severe thickening calcification of the aortic valve, compatible with the reported clinical history of severe aortic stenosis. 3. Aortic atherosclerosis, in addition to left main and 3 vessel coronary artery disease. 4. Changes in the lungs which may suggest developing interstitial lung disease. Outpatient referral to Pulmonology for further evaluation should be considered if clinically appropriate. 5. Colonic diverticulosis  without findings of acute diverticulitis at this time. 6. Additional incidental findings, as above. Electronically Signed   By: Trudie Reed M.D.   On: 08/08/2018 11:56   Vas US Carotid  Result Date: 08/08/2018 Carotid Arterial Duplex Study Indications: Pre op TAVR. Performing Technologist:  Toma Deiters RVS  Examination Guidelines: A complete evaluation includes B-mode imaging, spectral Doppler, color Doppler, and power Doppler as needed of all accessible portions of each vessel. Bilateral testing is considered an integral part of a complete examination. Limited examinations for reoccurring indications may be performed as noted.  Right Carotid Findings: +----------+--------+--------+--------+---------------------+------------------+           PSV cm/sEDV cm/sStenosisDescribe             Comments           +----------+--------+--------+--------+---------------------+------------------+ CCA Prox  66      8                                    mild intimal                                                              cahnges            +----------+--------+--------+--------+---------------------+------------------+ CCA Distal64      9               heterogenous and     mild plaque                                          diffuse                                 +----------+--------+--------+--------+---------------------+------------------+ ICA Prox  63      19              heterogenous         mild to moderate                                                          plaque at the                                                             origin             +----------+--------+--------+--------+---------------------+------------------+ ICA Mid   66      14                                                      +----------+--------+--------+--------+---------------------+------------------+ ICA  Distal69      17                                                      +----------+--------+--------+--------+---------------------+------------------+ ECA       60      3               heterogenous         mild plaque        +----------+--------+--------+--------+---------------------+------------------+  +----------+--------+-------+--------+-------------------+           PSV cm/sEDV cmsDescribeArm Pressure (mmHG) +----------+--------+-------+--------+-------------------+ Subclavian102                                        +----------+--------+-------+--------+-------------------+ +---------+--------+--+--------+-+ VertebralPSV cm/s39EDV cm/s6 +---------+--------+--+--------+-+  Left Carotid Findings: +----------+--------+--------+--------+------------+---------------------------+           PSV cm/sEDV cm/sStenosisDescribe    Comments                    +----------+--------+--------+--------+------------+---------------------------+ CCA Prox  58      13                          mild intimal wall changes   +----------+--------+--------+--------+------------+---------------------------+ CCA Distal72      17              heterogenousmild plaque and mild                                                      intimal wall changes        +----------+--------+--------+--------+------------+---------------------------+ ICA Prox  71      17              heterogenousmild plaque                 +----------+--------+--------+--------+------------+---------------------------+ ICA Mid   81      22                                                      +----------+--------+--------+--------+------------+---------------------------+ ICA Distal63      15                          tortuous                    +----------+--------+--------+--------+------------+---------------------------+ ECA       65      1               heterogenousmild plaque                 +----------+--------+--------+--------+------------+---------------------------+ +----------+--------+--------+--------+-------------------+ SubclavianPSV cm/sEDV cm/sDescribeArm Pressure (mmHG) +----------+--------+--------+--------+-------------------+           88                                           +----------+--------+--------+--------+-------------------+ +---------+--------+--+--------+--+  VertebralPSV cm/s46EDV cm/s11 +---------+--------+--+--------+--+  Summary: Right Carotid: Velocities in the right ICA are consistent with a 1-39% stenosis. Left Carotid: Velocities in the left ICA are consistent with a 1-39% stenosis. Vertebrals:  Bilateral vertebral arteries demonstrate antegrade flow. Subclavians: Normal flow hemodynamics were seen in bilateral subclavian              arteries. *See table(s) above for measurements and observations.  Electronically signed by Waverly Ferrari MD on 08/08/2018 at 8:02:55 PM.    Final    Ct Angio Abdomen Pelvis  W &/or Wo Contrast  Result Date: 08/08/2018 CLINICAL DATA:  85 year old female with history of severe aortic stenosis. Preprocedural study prior to potential transcatheter aortic valve replacement (TAVR). EXAM: CT ANGIOGRAPHY CHEST, ABDOMEN AND PELVIS TECHNIQUE: Multidetector CT imaging through the chest, abdomen and pelvis was performed using the standard protocol during bolus administration of intravenous contrast. Multiplanar reconstructed images and MIPs were obtained and reviewed to evaluate the vascular anatomy. CONTRAST:  ISOVUE-370 IOPAMIDOL (ISOVUE-370) INJECTION 76% COMPARISON:  No priors. FINDINGS: CTA CHEST FINDINGS Cardiovascular: Heart size is normal. There is no significant pericardial fluid, thickening or pericardial calcification. There is aortic atherosclerosis, as well as atherosclerosis of the great vessels of the mediastinum and the coronary arteries, including calcified atherosclerotic plaque in the left main, left anterior descending, left circumflex and right coronary arteries. Severe thickening and calcification of the aortic valve. Calcifications of the mitral annulus. Mediastinum/Lymph Nodes: No pathologically enlarged mediastinal or hilar lymph nodes. Esophagus is unremarkable in appearance. No axillary  lymphadenopathy. Lungs/Pleura: Lung apices are incompletely imaged. With this limitation in mind, there are no definite suspicious appearing pulmonary nodules or masses noted. No acute consolidative airspace disease. No pleural effusions. Scattered areas of mild peripheral predominant septal thickening, most evident throughout the mid to lower lungs. Scattered areas of very mild cylindrical bronchiectasis. Musculoskeletal/Soft Tissues: There are no aggressive appearing lytic or blastic lesions noted in the visualized portions of the skeleton. CTA ABDOMEN AND PELVIS FINDINGS Hepatobiliary: Small calcified granuloma in the liver. No suspicious cystic or solid hepatic lesions. No intra or extrahepatic biliary ductal dilatation. Gallbladder is normal in appearance. Pancreas: No pancreatic mass. No pancreatic ductal dilatation. No pancreatic or peripancreatic fluid or inflammatory changes. Spleen: Unremarkable. Adrenals/Urinary Tract: Low-attenuation lesions in both kidneys, the largest of which is in the interpolar region of the right kidney measuring 1.8 cm in diameter. Mild cortical thinning in both kidneys. No suspicious renal lesions. Bilateral adrenal glands are normal in appearance. No hydroureteronephrosis. Urinary bladder is normal in appearance. Stomach/Bowel: Normal appearance of the stomach. No pathologic dilatation of small bowel or colon. Numerous colonic diverticulae are noted, without surrounding inflammatory changes to suggest an acute diverticulitis at this time. Normal appendix. Vascular/Lymphatic: Aortic atherosclerosis, without evidence of aneurysm or dissection in the abdominal or pelvic vasculature. Vascular findings and measurements pertinent to potential TAVR procedure, as detailed below. No lymphadenopathy noted in the abdomen or pelvis. Reproductive: Uterus and ovaries are atrophic. Other: No significant volume of ascites.  No pneumoperitoneum. Musculoskeletal: There are no aggressive appearing  lytic or blastic lesions noted in the visualized portions of the skeleton. VASCULAR MEASUREMENTS PERTINENT TO TAVR: AORTA: Minimal Aortic Diameter-9 x 9 mm Severity of Aortic Calcification-severe RIGHT PELVIS: Right Common Iliac Artery - Minimal Diameter-6.6 x 6.4 mm Tortuosity-mild Calcification-moderate to severe Right External Iliac Artery - Minimal Diameter-5.8 x 6.0 mm Tortuosity - mild Calcification-none Right Common Femoral Artery - Minimal Diameter-6.2 x 5.2 mm Tortuosity - mild Calcification-mild LEFT PELVIS:  Left Common Iliac Artery - Minimal Diameter-7.8 x 7.0 mm Tortuosity - mild Calcification-moderate to severe Left External Iliac Artery - Minimal Diameter-6.1 x 6.3 mm Tortuosity - mild Calcification-none Left Common Femoral Artery - Minimal Diameter-6.6 x 5.8 mm Tortuosity - mild Calcification-mild Review of the MIP images confirms the above findings. IMPRESSION: 1. Vascular findings and measurements pertinent to potential TAVR procedure, as detailed above. 2. Severe thickening calcification of the aortic valve, compatible with the reported clinical history of severe aortic stenosis. 3. Aortic atherosclerosis, in addition to left main and 3 vessel coronary artery disease. 4. Changes in the lungs which may suggest developing interstitial lung disease. Outpatient referral to Pulmonology for further evaluation should be considered if clinically appropriate. 5. Colonic diverticulosis without findings of acute diverticulitis at this time. 6. Additional incidental findings, as above. Electronically Signed   By: Trudie Reed M.D.   On: 08/08/2018 11:56   Disposition   Pt is being discharged home today in good condition.  Follow-up Plans & Appointments    Follow-up Information    Janetta Hora, PA-C. Go on 08/24/2018.   Specialties:  Cardiology, Radiology Why:  @ 3:30pm, please arrive at least 10 minutes early  Contact information: [address] [place]  [address] [phone]            Discharge Medications   Allergies as of 08/17/2018      Reactions   Penicillins Rash   DID THE REACTION INVOLVE: Swelling of the face/tongue/throat, SOB, or low BP? No Sudden or severe rash/hives, skin peeling, or the inside of the mouth or nose? No Did it require medical treatment? No When did it last happen?more than 10 years ago If all above answers are "NO", may proceed with cephalosporin use.      Medication List    STOP taking these medications   furosemide 20 MG tablet Commonly known as:  LASIX   potassium chloride 10 MEQ tablet Commonly known as:  K-DUR     TAKE these medications   aspirin 81 MG EC tablet Take 1 tablet (81 mg total) by mouth daily.   atenolol 50 MG tablet Commonly known as:  TENORMIN Take 1 tablet (50 mg total) by mouth daily. What changed:  when to take this   CALCIUM PO Take 2 tablets by mouth daily.   cholecalciferol 25 MCG (1000 UT) tablet Commonly known as:  VITAMIN D3 Take 2,000 Units by mouth 2 (two) times daily.   clopidogrel 75 MG tablet Commonly known as:  PLAVIX Take 1 tablet (75 mg total) by mouth daily with breakfast. Start taking on:  August 18, 2018   Fish Oil 1000 MG Caps Take 2,000 mg by mouth daily.   multivitamin with minerals Tabs tablet Take 1 tablet by mouth daily. Centrum Silver   PRESERVISION AREDS 2 PO Take 1 tablet by mouth 2 (two) times daily.   REFRESH OP Place 1 drop into both eyes 2 (two) times daily.   simvastatin 20 MG tablet Commonly known as:  ZOCOR Take 1 tablet (20 mg total) by mouth daily. What changed:  when to take this   vitamin C 1000 MG tablet Take 2,000 mg by mouth daily.         Outstanding Labs/Studies   BMET  Duration of Discharge Encounter   Greater than 30 minutes including physician time.  Byrd Hesselbach, PA-C 08/17/2018, 9:33 AM [phone]  Patient seen, examined. Available data reviewed. Agree with  findings, assessment, and  plan as outlined by Carlean Jews, PA-C.  On my exam the patient is an elderly woman, sitting up on the bedside feeling well.  She has no complaints.  Lungs are clear, heart is regular rate and rhythm with a grade 2/6 ejection murmur at the right upper sternal border, abdomen is soft and nontender, extremities have no edema.  Bilateral groin sites are clear.  The patient's postoperative echo is reviewed and demonstrates normal function of her transcatheter heart valve with low gradients and mild paravalvular regurgitation.  LV function is normal.  We discussed her medications at discharge today.  She understands that she will need to be on dual antiplatelet therapy with aspirin and clopidogrel for 6 months.  She will then resume aspirin monotherapy long-term.  We discussed follow-up and postoperative restrictions.  Telemetry shows normal sinus rhythm without significant arrhythmia.  Overall the patient is doing very well and has had an uneventful recovery from TAVR.  Tonny Bollman, M.D. 08/17/2018 1:18 PM

## 2018-08-18 ENCOUNTER — Telehealth: Payer: Self-pay

## 2018-08-18 NOTE — Telephone Encounter (Signed)
I spoke with Jari Favre (son-in-law) and he has not been to see the pt today.  He states the pt is doing well following TAVR and he has no additional questions at this time.  He asked me to reach out to [place] (niece) who stayed with the pt last night and see if she had any concerns.  I contacted Kriste Basque and the pt is doing great. Kriste Basque keeps having to remind the pt to take it easy and not overdue it. The pt is a little concerned that her memory is not working as well after TAVR.  Per Kriste Basque the pt got up in the middle of the night and tried to go to her daughter's house.  I advised that this could be related to the hospitalization and anesthesia and will hopefully improve as she gets further out from surgery.  The family will plan to stay with the pt tonight.   Patient's family contacted regarding discharge from [facility]  on 08/17/2018.  Patient understands to follow up with provider Carlean Jews PA-C on 08/24/2018 at 3:30 PM at [facility] location. Patient understands discharge instructions? yes Patient understands medications and regiment? yes Patient understands to bring all medications to this visit? yes

## 2018-08-18 NOTE — Telephone Encounter (Signed)
Left message on machine for Natalie Sherman or pt to contact the office.

## 2018-08-18 NOTE — Telephone Encounter (Signed)
thx

## 2018-08-24 ENCOUNTER — Encounter: Payer: Self-pay | Admitting: Physician Assistant

## 2018-08-24 ENCOUNTER — Ambulatory Visit (INDEPENDENT_AMBULATORY_CARE_PROVIDER_SITE_OTHER): Payer: Medicare Other | Admitting: Physician Assistant

## 2018-08-24 VITALS — BP 128/70 | HR 60 | Ht 63.0 in | Wt 134.8 lb

## 2018-08-24 DIAGNOSIS — Z952 Presence of prosthetic heart valve: Secondary | ICD-10-CM | POA: Diagnosis not present

## 2018-08-24 DIAGNOSIS — I10 Essential (primary) hypertension: Secondary | ICD-10-CM

## 2018-08-24 DIAGNOSIS — I5032 Chronic diastolic (congestive) heart failure: Secondary | ICD-10-CM

## 2018-08-24 DIAGNOSIS — N289 Disorder of kidney and ureter, unspecified: Secondary | ICD-10-CM | POA: Diagnosis not present

## 2018-08-24 MED ORDER — FUROSEMIDE 20 MG PO TABS: 20.0000 mg | ORAL_TABLET | Freq: Every day | ORAL | 11 refills | Status: AC | PRN

## 2018-08-24 MED ORDER — POTASSIUM CHLORIDE ER 10 MEQ PO TBCR: 10.0000 meq | EXTENDED_RELEASE_TABLET | Freq: Every day | ORAL | 11 refills | Status: AC | PRN

## 2018-08-24 NOTE — Progress Notes (Signed)
[facility]   MULTIDISCIPLINARY HEART VALVE CLINIC                                       Cardiology Office Note    Date:  08/24/2018   ID:  Natalie Sherman, DOB 1921/09/12, MRN [id number]  PCP:  Lupita Raider, MD  Cardiologist:  Dr. Mariah Milling / Dr. Excell Seltzer & Dr. Laneta Simmers (TAVR)  CC: [facility] s/p TAVR  History of Present Illness:  Natalie Sherman is a 85 y.o. female with a history of mod MR, CKD stage III, HTN, HLD, anemia, chronic diastolic CHF with recent admissionand severe AS s/p TAVR (08/15/18) who presents to clinic for follow up.   She was in her usual state of excellent health until the morning of 06/16/18 when she woke up with acute dyspnea. She was taken to the emergency room at [facility] by her family and diagnosed with pulmonary edema and acute congestive heart failure. Echocardiogram on 06/17/2018 showed a trileaflet aortic valve with mild calcification and thickening of the leaflets. The mean gradient across the valve was measured at 40 mmHg with a peak gradient of 98 mmHg. The dimensionless index was 0.16 and the valve area was measured at 0.52 cm. Left ventricular ejection fraction was 65%. There was moderate mitral regurgitation. L/RHC showed calcified coronary arteries with minor nonobstructive CAD.  She underwent successful TAVR with a24mm Edwards Sapien 3 THV via the TF approach on 08/15/18. Post operative echo showed EF 60%, normally functioning TAVR with mean gradient of 6.0 mm Hg and mild PVL. She had mild elevated of her creat from 0.9--> 1.3. Lasix 20mg  QOD and Kdur 10 Meq QOD were held.   Today she presents to clinic for follow up. Her with daughter and son in law. No CP or SOB. No LE edema, orthopnea or PND. No dizziness or syncope. No blood in stool or urine. No palpitations. She is doing quite well and anxious to get back driving and riding her stationary bike.  Past Medical History:  Diagnosis Date  . Arthritis   . Chronic diastolic CHF (congestive heart  failure) (HCC)    a. EF of 60 to 65%, moderate LVH, no regional wall motion abnormalities, grade 1 diastolic dysfunction, severe aortic stenosis with a valve area less than 0.5 cm, mild aortic insufficiency, moderate mitral regurgitation  . CKD (chronic kidney disease) stage 3, GFR 30-59 ml/min (HCC)   . Heart murmur   . History of blood transfusion    after d/c   . Hyperlipidemia   . Hypertension   . S/P TAVR (transcatheter aortic valve replacement)   . Scoliosis   . Severe aortic stenosis     Past Surgical History:  Procedure Laterality Date  . CARDIAC CATHETERIZATION  07/25/2018  . DILATION AND CURETTAGE OF UTERUS  1968  . RIGHT HEART CATH AND CORONARY ANGIOGRAPHY N/A 07/25/2018   Procedure: RIGHT HEART CATH AND CORONARY ANGIOGRAPHY;  Surgeon: Tonny Bollman, MD;  Location: [facility] INVASIVE CV LAB;  Service: Cardiovascular;  Laterality: N/A;  . TEE WITHOUT CARDIOVERSION N/A 08/15/2018   Procedure: TRANSESOPHAGEAL ECHOCARDIOGRAM (TEE);  Surgeon: Tonny Bollman, MD;  Location: [facility] OR;  Service: Open Heart Surgery;  Laterality: N/A;  . TRANSCATHETER AORTIC VALVE REPLACEMENT, TRANSFEMORAL N/A 08/15/2018   Procedure: TRANSCATHETER AORTIC VALVE REPLACEMENT, TRANSFEMORAL;  Surgeon: Tonny Bollman, MD;  Location: [facility] OR;  Service: Open Heart Surgery;  Laterality: N/A;  Current Medications: Outpatient Medications Prior to Visit  Medication Sig Dispense Refill  . Ascorbic Acid (VITAMIN C) 1000 MG tablet Take 2,000 mg by mouth daily.    Marland Kitchen aspirin 81 MG EC tablet Take 1 tablet (81 mg total) by mouth daily. 30 tablet 2  . atenolol (TENORMIN) 50 MG tablet Take 1 tablet (50 mg total) by mouth daily. 90 tablet 0  . CALCIUM PO Take 2 tablets by mouth daily.    . cholecalciferol (VITAMIN D3) 25 MCG (1000 UT) tablet Take 2,000 Units by mouth 2 (two) times daily.     . clindamycin (CLEOCIN) 300 MG capsule Take 300 mg by mouth. Take 600 mg 1 hour prior to all dental visits.    . clopidogrel (PLAVIX) 75 MG  tablet Take 1 tablet (75 mg total) by mouth daily with breakfast. 90 tablet 1  . Multiple Vitamin (MULTIVITAMIN WITH MINERALS) TABS tablet Take 1 tablet by mouth daily. Centrum Silver    . Multiple Vitamins-Minerals (PRESERVISION AREDS 2 PO) Take 1 tablet by mouth 2 (two) times daily.    . Omega-3 Fatty Acids (FISH OIL) 1000 MG CAPS Take 2,000 mg by mouth daily.     . Polyvinyl Alcohol-Povidone (REFRESH OP) Place 1 drop into both eyes 2 (two) times daily.    . simvastatin (ZOCOR) 20 MG tablet Take 1 tablet (20 mg total) by mouth daily. 90 tablet 0   No facility-administered medications prior to visit.      Allergies:   Penicillins   Social History   Socioeconomic History  . Marital status: Widowed    Spouse name: Not on file  . Number of children: Not on file  . Years of education: Not on file  . Highest education level: Not on file  Occupational History  . Occupation: retired  Engineer, production  . Financial resource strain: Not on file  . Food insecurity:    Worry: Not on file    Inability: Not on file  . Transportation needs:    Medical: Not on file    Non-medical: Not on file  Tobacco Use  . Smoking status: Never Smoker  . Smokeless tobacco: Never Used  Substance and Sexual Activity  . Alcohol use: Never    Frequency: Never  . Drug use: Never  . Sexual activity: Not Currently  Lifestyle  . Physical activity:    Days per week: Not on file    Minutes per session: Not on file  . Stress: Not on file  Relationships  . Social connections:    Talks on phone: Not on file    Gets together: Not on file    Attends religious service: Not on file    Active member of club or organization: Not on file    Attends meetings of clubs or organizations: Not on file    Relationship status: Not on file  Other Topics Concern  . Not on file  Social History Narrative  . Not on file     Family History:  The patient's family history includes COPD in her father; Diabetes Mellitus II in her  mother; Heart disease in her father.     ROS:   Please see the history of present illness.    ROS All other systems reviewed and are negative.   PHYSICAL EXAM:   VS:  BP 128/70   Pulse 60   Ht 5\' 3"  (1.6 m)   Wt 134 lb 12.8 oz (61.1 kg)   SpO2 97%   BMI  23.88 kg/m    GEN: Well nourished, well developed, in no acute distress HEENT: normal Neck: no JVD or masses Cardiac: RRR; soft flow murmur. No rubs, or gallops,no edema  Respiratory:  clear to auscultation bilaterally, normal work of breathing GI: soft, nontender, nondistended, + BS MS: no deformity or atrophy Skin: warm and dry, no rash Neuro:  Alert and Oriented x 3, Strength and sensation are intact Psych: euthymic mood, full affect   Wt Readings from Last 3 Encounters:  08/24/18 134 lb 12.8 oz (61.1 kg)  08/17/18 134 lb (60.8 kg)  08/11/18 123 lb 8 oz (56 kg)      Studies/Labs Reviewed:   EKG:  EKG is ordered today.  The ekg ordered today demonstrates sinus, LAD HR 60  Recent Labs: 08/11/2018: ALT 18; B Natriuretic Peptide 867.6 08/16/2018: Magnesium 2.0 08/17/2018: BUN 25; Creatinine, Ser 1.30; Hemoglobin 9.1; Platelets 148; Potassium 4.0; Sodium 137   Lipid Panel    Component Value Date/Time   CHOL 120 06/17/2018 0400   TRIG 117 06/17/2018 0400   HDL 25 (L) 06/17/2018 0400   CHOLHDL 4.8 06/17/2018 0400   VLDL 23 06/17/2018 0400   LDLCALC 72 06/17/2018 0400    Additional studies/ records that were reviewed today include:  TAVR OPERATIVE NOTE   Date of Procedure:08/15/2018  Preoperative Diagnosis:Severe Aortic Stenosis   Postoperative Diagnosis:Same   Procedure:   Transcatheter Aortic Valve Replacement - PercutaneousRightTransfemoral Approach Edwards Sapien 3 THV (size 26mm, model # 9600TFX, serial # [id number])  Co-Surgeons:Bryan Jennefer Bravo, MD and Tonny Bollman, MD   Anesthesiologist:R. Bradley Ferris,  MD  Echocardiographer:Peter Eden Emms, MD  Pre-operative Echo Findings: ? Severe aortic stenosis ? Normalleft ventricular systolic function  Post-operative Echo Findings: ? Noparavalvular leak ? Normalleft ventricular systolic function   ______________   Echo 08/16/18: IMPRESSIONS 1. The left ventricle has normal systolic function, with an ejection fraction of 60-65%. Left ventricular diastology could not be evaluated due to nondiagnostic images. 2. The mitral valve is normal in structure. Moderate thickening of the mitral valve leaflet. There is severe mitral annular calcification present. 3. The tricuspid valve was normal in structure. 4. S/P 26mm Edwards Sapien 3 THV TAVR which is well seated with normal function. There is a mild perivalvular leak. AV Area (VTI): 1.90 cm, AV Mean Grad: 6.0 mmHg,LVOT/AV VTI ratio: 0.67. 5. The pericardial effusion is posterior to the left ventricle. 6. Trivial pericardial effusion is present.   ASSESSMENT & PLAN:   Severe AS s/p TAVR: doing excellent. Groin sites are stable. ECG with sinus and no HAVB. She has been cleared to drive and resume biking on her stationary bike. SBE prophylaxis discussed; I have RX'd clindamycin due to a PCN allergy. Continue aspirin and plavix. I will see her back 3/25 for 1 month follow up and echo.   HTN: BP well controlled today   Chronic diastolic CHF: she appears euvolemic. Lasix and kdur held at DC given mild bump in creat during admission. She had had no swelling or SOB off diuretics. I have asked her to take her lasix/potassium PRN for weight gain or swelling  CKD stage III: creat trended upward 0.9--> 1.3 during admission. Diuretics held. Will check BMET today.    Medication Adjustments/Labs and Tests Ordered: Current medicines are reviewed at length with the patient today.  Concerns regarding medicines are outlined above.  Medication changes, Labs and Tests ordered today are  listed in the Patient Instructions below. Patient Instructions  Medication Instructions:  1) you may take LASIX  20 mg once daily as needed for swelling/weight gain of 3 pounds overnight or 5 pounds in 1 week 2) if you take Lasix, take KDUR with it  Labwork: TODAY: BMET  Follow-Up: Please keep your upcoming appointments!    Signed, Cline Crock, PA-C  08/24/2018 4:06 PM    [facility] [address] [place], [place], [place]  [address] Phone: [phone]; Fax: [phone]

## 2018-08-24 NOTE — Patient Instructions (Addendum)
Medication Instructions:  1) you may take LASIX 20 mg once daily as needed for swelling/weight gain of 3 pounds overnight or 5 pounds in 1 week 2) if you take Lasix, take KDUR with it  Labwork: TODAY: BMET  Follow-Up: Please keep your upcoming appointments!

## 2018-08-25 ENCOUNTER — Encounter: Payer: Self-pay | Admitting: Thoracic Surgery (Cardiothoracic Vascular Surgery)

## 2018-08-25 LAB — BASIC METABOLIC PANEL
BUN/Creatinine Ratio: 22 (ref 12–28)
BUN: 22 mg/dL (ref 10–36)
CO2: 24 mmol/L (ref 20–29)
CREATININE: 0.99 mg/dL (ref 0.57–1.00)
Calcium: 9.7 mg/dL (ref 8.7–10.3)
Chloride: 102 mmol/L (ref 96–106)
GFR calc Af Amer: 56 mL/min/{1.73_m2} — ABNORMAL LOW (ref >59)
GFR calc non Af Amer: 49 mL/min/{1.73_m2} — ABNORMAL LOW (ref >59)
Glucose: 103 mg/dL — ABNORMAL HIGH (ref 65–99)
Potassium: 4.6 mmol/L (ref 3.5–5.2)
SODIUM: 141 mmol/L (ref 134–144)

## 2018-09-01 ENCOUNTER — Encounter: Payer: Self-pay | Admitting: Physician Assistant

## 2018-09-07 ENCOUNTER — Telehealth: Payer: Self-pay | Admitting: Cardiovascular Disease

## 2018-09-07 NOTE — Telephone Encounter (Signed)
Daughter of Pt wants to know if it is safe for her mom to come in for the procedure scheduled for Wednesday. Worried about Mom's age and maintaining social distancing.  Daughter wants to make sure test and f/u are necessary right now, or if they can be postponed

## 2018-09-07 NOTE — Telephone Encounter (Signed)
Spoke with patient's daughter. Informed her that the echo scheduled 3/25 will be cancelled and rescheduled to a later date. She understands NOT to come to the office, and that Carlean Jews will call her during her office visit time. She was grateful for assistance.  They request a call at [phone] for tele-appointment.

## 2018-09-07 NOTE — Telephone Encounter (Signed)
Thank you :)

## 2018-09-11 ENCOUNTER — Ambulatory Visit: Payer: Medicare Other | Admitting: Physician Assistant

## 2018-09-11 ENCOUNTER — Other Ambulatory Visit (HOSPITAL_COMMUNITY): Payer: Medicare Other

## 2018-09-13 ENCOUNTER — Other Ambulatory Visit: Payer: Self-pay | Admitting: Physician Assistant

## 2018-09-13 ENCOUNTER — Other Ambulatory Visit (HOSPITAL_COMMUNITY): Payer: Medicare Other

## 2018-09-13 ENCOUNTER — Other Ambulatory Visit: Payer: Self-pay

## 2018-09-13 ENCOUNTER — Telehealth (INDEPENDENT_AMBULATORY_CARE_PROVIDER_SITE_OTHER): Payer: Medicare Other | Admitting: Physician Assistant

## 2018-09-13 DIAGNOSIS — Z952 Presence of prosthetic heart valve: Secondary | ICD-10-CM

## 2018-09-13 NOTE — Patient Instructions (Addendum)
Hello Ms Whitford,  It was so nice to talk to you on the phone today. I am so glad you are doing so well. I just wanted to send you a recap of our discussion.   Please continue taking antibiotics prior to any dental work including cleanings. You can discontinue plavix after 6 months of therapy (around 02/13/19) or when your pills run out (you were given a 90 day supply with one refill). Please continue on aspirin 81 mg indefinitely.   Your 1 month echo has been rescheduled to 10/19/18.  You have been placed on a recall list for Dr. Mariah Milling and should be seen in June or July of this year.   I will see you back in 1 years time with an echo.   All appointment details are attached to this letter in your AVS (after visit summary).   Please call us with any questions or concerns you may have and please stay safe during these uncertain times.  Carlean Jews

## 2018-09-13 NOTE — Progress Notes (Signed)
[facility]   MULTIDISCIPLINARY HEART VALVE TEAM   Evaluation Performed:  Follow-up visit  This visit type was conducted due to national recommendations for restrictions regarding the COVID-19 Pandemic (e.g. social distancing).  This format is felt to be most appropriate for this patient at this time.  All issues noted in this document were discussed and addressed.  No physical exam was performed (except for noted visual exam findings with Telehealth visits).  The patient has consented to conduct a Telehealth visit and understands insurance will be billed.   Date:  09/13/2018   ID:  Natalie Sherman, DOB 1921/08/08, MRN [id number]  Patient Location:  [address] [place] [address]   Provider location:   [place], [place] [address]  PCP:  Lupita Raider, MD  Cardiologist:  Dr. Mariah Milling / Dr. Excell Seltzer & Dr. Laneta Simmers (TAVR)    Chief Complaint:  1 month s/p TAVR  History of Present Illness:    Natalie Sherman is a 85 y.o. female with a history of mod MR, CKD stage III, HTN, HLD,anemia,chronic diastolic CHF with recent admissionand severe AS s/p TAVR (08/15/18) who presents via audio conferencing for a telehealth visit today.    The pt does not have symptoms concerning for COVID-19 infection (fever, chills, cough, or new SHORTNESS OF BREATH).   She was in her usual state of excellent health until the morning of 06/16/18 when she woke up with acute dyspnea.She was taken to the emergency room at [facility] by her family and diagnosed with pulmonary edema and acute congestive heart failure.Echocardiogram on 06/17/2018 showed a trileaflet aortic valve with mild calcification and thickening of the leaflets. The mean gradient across the valve was measured at 40 mmHg with a peak gradient of 98 mmHg. The dimensionless index was 0.16 and the valve area was measured at 0.52 cm. Left ventricular ejection fraction was 65%. There was moderate mitral regurgitation.L/RHC showed calcified coronary  arteries with minor nonobstructive CAD.  She underwent successful TAVR with a75mm Edwards Sapien 3 THV via the TF approach on 08/15/18. Post operative echoshowed EF 60%, normally functioning TAVR with mean gradient of 6.0 mm Hg and mild PVL.   She has done excellent since her TAVR with complete resolution of heart failure. Now only on PRN lasix. No CP or SOB. No LE edema, orthopnea or PND. No dizziness or syncope. No blood in stool or urine. No palpitations. She is back exercising everyday and feeling great. She has been staying inside and her children are doing her grocery shopping for her given Covid 19 pandemic.   Prior CV studies:   The following studies were reviewed today:  TAVR OPERATIVE NOTE   Date of Procedure:08/15/2018  Preoperative Diagnosis:Severe Aortic Stenosis   Postoperative Diagnosis:Same   Procedure:   Transcatheter Aortic Valve Replacement - PercutaneousRightTransfemoral Approach Edwards Sapien 3 THV (size 46mm, model # 9600TFX, serial # [id number])  Co-Surgeons:Bryan Jennefer Bravo, MD and Tonny Bollman, MD   Anesthesiologist:R. Bradley Ferris, MD  Echocardiographer:Peter Eden Emms, MD  Pre-operative Echo Findings: ? Severe aortic stenosis ? Normalleft ventricular systolic function  Post-operative Echo Findings: ? Noparavalvular leak ? Normalleft ventricular systolic function   ______________   Echo 08/16/18: IMPRESSIONS 1. The left ventricle has normal systolic function, with an ejection fraction of 60-65%. Left ventricular diastology could not be evaluated due to nondiagnostic images. 2. The mitral valve is normal in structure. Moderate thickening of the mitral valve leaflet. There is severe mitral annular calcification present. 3. The tricuspid valve was normal in structure.  4. S/P 34mm Edwards Sapien 3 THV TAVR which is well seated  with normal function. There is a mild perivalvular leak. AV Area (VTI): 1.90 cm, AV Mean Grad: 6.0 mmHg,LVOT/AV VTI ratio: 0.67. 5. The pericardial effusion is posterior to the left ventricle. 6. Trivial pericardial effusion is present.  Past Medical History:  Diagnosis Date  . Arthritis   . Chronic diastolic CHF (congestive heart failure) (HCC)    a. EF of 60 to 65%, moderate LVH, no regional wall motion abnormalities, grade 1 diastolic dysfunction, severe aortic stenosis with a valve area less than 0.5 cm, mild aortic insufficiency, moderate mitral regurgitation  . CKD (chronic kidney disease) stage 3, GFR 30-59 ml/min (HCC)   . Heart murmur   . History of blood transfusion    after d/c   . Hyperlipidemia   . Hypertension   . S/P TAVR (transcatheter aortic valve replacement)   . Scoliosis   . Severe aortic stenosis    Past Surgical History:  Procedure Laterality Date  . CARDIAC CATHETERIZATION  07/25/2018  . DILATION AND CURETTAGE OF UTERUS  1968  . RIGHT HEART CATH AND CORONARY ANGIOGRAPHY N/A 07/25/2018   Procedure: RIGHT HEART CATH AND CORONARY ANGIOGRAPHY;  Surgeon: Tonny Bollman, MD;  Location: [facility] INVASIVE CV LAB;  Service: Cardiovascular;  Laterality: N/A;  . TEE WITHOUT CARDIOVERSION N/A 08/15/2018   Procedure: TRANSESOPHAGEAL ECHOCARDIOGRAM (TEE);  Surgeon: Tonny Bollman, MD;  Location: [facility] OR;  Service: Open Heart Surgery;  Laterality: N/A;  . TRANSCATHETER AORTIC VALVE REPLACEMENT, TRANSFEMORAL N/A 08/15/2018   Procedure: TRANSCATHETER AORTIC VALVE REPLACEMENT, TRANSFEMORAL;  Surgeon: Tonny Bollman, MD;  Location: [facility] OR;  Service: Open Heart Surgery;  Laterality: N/A;     No outpatient medications have been marked as taking for the 09/13/18 encounter (Appointment) with Janetta Hora, PA-C.     Allergies:   Penicillins   Social History   Tobacco Use  . Smoking status: Never Smoker  . Smokeless tobacco: Never Used  Substance Use Topics  . Alcohol use:  Never    Frequency: Never  . Drug use: Never     Family Hx: The patient's family history includes COPD in her father; Diabetes Mellitus II in her mother; Heart disease in her father.  ROS:   Please see the history of present illness.    All other systems reviewed and are negative.   Labs/Other Tests and Data Reviewed:    Recent Labs: 08/11/2018: ALT 18; B Natriuretic Peptide 867.6 08/16/2018: Magnesium 2.0 08/17/2018: Hemoglobin 9.1; Platelets 148 08/24/2018: BUN 22; Creatinine, Ser 0.99; Potassium 4.6; Sodium 141   Recent Lipid Panel Lab Results  Component Value Date/Time   CHOL 120 06/17/2018 04:00 AM   TRIG 117 06/17/2018 04:00 AM   HDL 25 (L) 06/17/2018 04:00 AM   CHOLHDL 4.8 06/17/2018 04:00 AM   LDLCALC 72 06/17/2018 04:00 AM    Wt Readings from Last 3 Encounters:  08/24/18 134 lb 12.8 oz (61.1 kg)  08/17/18 134 lb (60.8 kg)  08/11/18 123 lb 8 oz (56 kg)     Exam:    There were no vitals filed for this visit.  Not completed as visit conducted over the phone  ASSESSMENT & PLAN:    Severe AS s/p TAVR: echo today was postponed until 4/30 given covid pandemic. She has NYHA class I symptoms and back exercising everyday. SBE prophylaxis discussed; she has clindamycin. Plavix can be discontinued after 6 months of therapy (01/2019). She will see Dr. Mariah Milling back in 3-4  months and me back in 1 year for follow up and echo.   KCCQ completed over the phone.   COVID-19 Education: The signs and symptoms of COVID-19 were discussed with the patient and how to seek care for testing (follow up with PCP or arrange E-visit).  The importance of social distancing was discussed today.  Patient Risk:   After full review of this patients clinical status, I feel that they are at least moderate risk at this time.  Time:   Today, I have spent 15 minutes with the patient with telehealth technology discussing her recovery after her TAVR and instructions going forward.     Medication  Adjustments/Labs and Tests Ordered: Current medicines are reviewed at length with the patient today.  Concerns regarding medicines are outlined above.  Tests Ordered: None  Medication Changes: No orders of the defined types were placed in this encounter.   Disposition:  3-4 months with Dr. Mariah Milling  Signed, Cline Crock, PA-C  09/13/2018 1:06 PM    [facility] [address] [place], [place], [place]  [address] Phone: [phone]; Fax: [phone]

## 2018-09-27 ENCOUNTER — Ambulatory Visit: Payer: Medicare Other | Admitting: Family

## 2018-10-04 ENCOUNTER — Ambulatory Visit: Payer: Medicare Other | Admitting: Family

## 2018-10-19 ENCOUNTER — Other Ambulatory Visit (HOSPITAL_COMMUNITY): Payer: Medicare Other

## 2018-10-20 ENCOUNTER — Other Ambulatory Visit: Payer: Self-pay | Admitting: Physician Assistant

## 2018-10-20 NOTE — Telephone Encounter (Signed)
Please review for refill.  

## 2018-11-17 ENCOUNTER — Other Ambulatory Visit: Payer: Self-pay | Admitting: Physician Assistant

## 2018-12-18 ENCOUNTER — Encounter: Payer: Self-pay | Admitting: Thoracic Surgery (Cardiothoracic Vascular Surgery)

## 2019-01-23 ENCOUNTER — Other Ambulatory Visit (HOSPITAL_COMMUNITY): Payer: Medicare Other

## 2019-02-04 ENCOUNTER — Other Ambulatory Visit: Payer: Self-pay | Admitting: Physician Assistant

## 2019-02-19 ENCOUNTER — Other Ambulatory Visit: Payer: Self-pay | Admitting: Physician Assistant

## 2019-03-26 ENCOUNTER — Other Ambulatory Visit (HOSPITAL_COMMUNITY): Payer: Medicare Other

## 2019-03-30 DIAGNOSIS — Z23 Encounter for immunization: Secondary | ICD-10-CM | POA: Diagnosis not present

## 2019-07-14 ENCOUNTER — Other Ambulatory Visit: Payer: Self-pay | Admitting: Physician Assistant

## 2019-07-17 ENCOUNTER — Telehealth: Payer: Self-pay | Admitting: Physician Assistant

## 2019-07-17 NOTE — Telephone Encounter (Signed)
Patient's daughter states that someone called her mother this morning to move an appointment and testing but she is not sure what it is and does not feel comfortable bringing her mother into the office right now. I advised her that her mother is set up for an echocardiogram on 02/24 as well as an appointment with Cline Crock, PA-C. Daughter states she really does not want to bring in her mother if she doesn't have to and would like to know if Samara Deist thought that the echocardiogram was really necessary and whether they could switch the appointment to virtual.

## 2019-07-17 NOTE — Telephone Encounter (Signed)
That is totally fine. Can you cancel the echo and switch the apt to virtual and let the patient's daughter know? Thank you!

## 2019-07-17 NOTE — Telephone Encounter (Signed)
New Message    Pts daughter is calling and is asking for Cline Crock to call her back  She says she does not feel comfortable bringing the pt into the office right now     Please call

## 2019-07-17 NOTE — Telephone Encounter (Signed)
Let patient's daughter know that we would switch appointment on 02/24 to a virtual visit and that we would cancel the echocardiogram

## 2019-08-02 NOTE — Progress Notes (Signed)
[facility]ASC[facility]   MULTIDISCIPLINARY HEART VALVE TEAM  Virtual Visit via Telephone Note   This visit type was conducted due to national recommendations for restrictions regarding the COVID-19 Pandemic (e.g. social distancing) in an effort to limit this patient's exposure and mitigate transmission in our community.  Due to her co-morbid illnesses, this patient is at least at moderate risk for complications without adequate follow up.  This format is felt to be most appropriate for this patient at this time.  The patient did not have access to video technology/had technical difficulties with video requiring transitioning to audio format only (telephone).  All issues noted in this document were discussed and addressed.  No physical exam could be performed with this format.  Please refer to the patient's chart for her  consent to telehealth for [facility].   Evaluation Performed:  Follow-up visit  Date:  08/15/2019   ID:  Natalie Sherman, DOB May 03, 1922, MRN [id number]  Patient Location: Home Provider Location: Office  PCP:  Lupita Raider, MD  Cardiologist:  Dr. Mariah Milling / Dr. Excell Seltzer & Dr. Laneta Simmers (TAVR) Electrophysiologist:  None   Chief Complaint:  1 year s/p TAVR  History of Present Illness:    Natalie Sherman is a 86 y.o. female with a history of mod MR, CKD stage III, HTN, HLD,anemia,chronic diastolic CHF with recent admissionand severe ASs/p TAVR (08/15/18) who presents via virtual medicine for follow up.   She was in her usual state of excellent health until the morning of 06/16/18 when she woke up with acute dyspnea.She was taken to the emergency room at [facility] by her family and diagnosed with pulmonary edema and acute congestive heart failure.Echocardiogram on 06/17/2018 showed a trileaflet aortic valve with mild calcification and thickening of the leaflets. The mean gradient across the valve was measured at 40 mmHg with a peak gradient of 98 mmHg. The dimensionless  index was 0.16 and the valve area was measured at 0.52 cm. Left ventricular ejection fraction was 65%. There was moderate mitral regurgitation.L/RHC showed calcified coronary arteries with minor nonobstructive CAD.  She underwentsuccessful TAVR with a57mm Edwards Sapien 3 THV via the TF approach on 08/15/18. Post operative echoshowed EF 60%, normally functioning TAVR with mean gradient of 6.0 mm Hg and mild PVL.She has done excellent since her TAVR with complete resolution of heart failure. Now only on PRN lasix.  Today she presents via virtual medicine for follow up. The patient does not have symptoms concerning for COVID-19 infection (fever, chills, cough, or new shortness of breath). We have not done any follow up echocardiograms due to the patient and family's concern for exposure during the covid 19 pandemic. No CP or SOB. No LE edema, orthopnea or PND. No dizziness or syncope. No blood in stool or urine. No palpitations.  She stays active cooking and cleaning around the house. She is only occasionally limited by fatigue.   Past Medical History:  Diagnosis Date  . Arthritis   . Chronic diastolic CHF (congestive heart failure) (HCC)    a. EF of 60 to 65%, moderate LVH, no regional wall motion abnormalities, grade 1 diastolic dysfunction, severe aortic stenosis with a valve area less than 0.5 cm, mild aortic insufficiency, moderate mitral regurgitation  . CKD (chronic kidney disease) stage 3, GFR 30-59 ml/min   . Heart murmur   . History of blood transfusion    after d/c   . Hyperlipidemia   . Hypertension   . S/P TAVR (transcatheter aortic valve replacement)   .  Scoliosis   . Severe aortic stenosis    Past Surgical History:  Procedure Laterality Date  . CARDIAC CATHETERIZATION  07/25/2018  . DILATION AND CURETTAGE OF UTERUS  1968  . RIGHT HEART CATH AND CORONARY ANGIOGRAPHY N/A 07/25/2018   Procedure: RIGHT HEART CATH AND CORONARY ANGIOGRAPHY;  Surgeon: Tonny Bollman, MD;   Location: [facility] INVASIVE CV LAB;  Service: Cardiovascular;  Laterality: N/A;  . TEE WITHOUT CARDIOVERSION N/A 08/15/2018   Procedure: TRANSESOPHAGEAL ECHOCARDIOGRAM (TEE);  Surgeon: Tonny Bollman, MD;  Location: [facility] OR;  Service: Open Heart Surgery;  Laterality: N/A;  . TRANSCATHETER AORTIC VALVE REPLACEMENT, TRANSFEMORAL N/A 08/15/2018   Procedure: TRANSCATHETER AORTIC VALVE REPLACEMENT, TRANSFEMORAL;  Surgeon: Tonny Bollman, MD;  Location: [facility] OR;  Service: Open Heart Surgery;  Laterality: N/A;     Current Meds  Medication Sig  . Ascorbic Acid (VITAMIN C) 1000 MG tablet Take 2,000 mg by mouth daily.  Marland Kitchen aspirin 81 MG EC tablet Take 1 tablet (81 mg total) by mouth daily.  Marland Kitchen atenolol (TENORMIN) 50 MG tablet TAKE ONE TABLET BY MOUTH EVERY DAY  . CALCIUM PO Take 2 tablets by mouth daily.  . cholecalciferol (VITAMIN D3) 25 MCG (1000 UT) tablet Take 2,000 Units by mouth 2 (two) times daily.   . clindamycin (CLEOCIN) 300 MG capsule Take 300 mg by mouth. Take 600 mg 1 hour prior to all dental visits.  . clopidogrel (PLAVIX) 75 MG tablet TAKE 1 TABLET (75 MG TOTAL) BY MOUTH DAILY WITH BREAKFAST.  . furosemide (LASIX) 20 MG tablet Take 1 tablet (20 mg total) by mouth daily as needed.  . Multiple Vitamin (MULTIVITAMIN WITH MINERALS) TABS tablet Take 1 tablet by mouth daily. Centrum Silver  . Multiple Vitamins-Minerals (PRESERVISION AREDS 2 PO) Take 1 tablet by mouth 2 (two) times daily.  . Omega-3 Fatty Acids (FISH OIL) 1000 MG CAPS Take 2,000 mg by mouth daily.   . Polyvinyl Alcohol-Povidone (REFRESH OP) Place 1 drop into both eyes 2 (two) times daily.  . potassium chloride (K-DUR) 10 MEQ tablet Take 1 tablet (10 mEq total) by mouth daily as needed.  . simvastatin (ZOCOR) 20 MG tablet TAKE 1 TABLET BY MOUTH DAILY     Allergies:   Penicillins   Social History   Tobacco Use  . Smoking status: Never Smoker  . Smokeless tobacco: Never Used  Substance Use Topics  . Alcohol use: Never  . Drug use:  Never     Family Hx: The patient's family history includes COPD in her father; Diabetes Mellitus II in her mother; Heart disease in her father.  ROS:   Please see the history of present illness.    All other systems reviewed and are negative.   Prior CV studies:   The following studies were reviewed today:  Echo 08/16/18 IMPRESSIONS 1. The left ventricle has normal systolic function, with an ejection  fraction of 60-65%. Left ventricular diastology could not be evaluated due to nondiagnostic images.  2. The mitral valve is normal in structure. Moderate thickening of the  mitral valve leaflet. There is severe mitral annular calcification  present.  3. The tricuspid valve was normal in structure.  4. S/P 43mm Edwards Sapien 3 THV TAVR which is well seated with normal  function. There is a mild perivalvular leak. AV Area (VTI): 1.90 cm, AV  Mean Grad: 6.0 mmHg,LVOT/AV VTI ratio: 0.67.  5. The pericardial effusion is posterior to the left ventricle.  6. Trivial pericardial effusion is present.    Labs/Other  Tests and Data Reviewed:    EKG:  No ECG reviewed.  Recent Labs: 08/16/2018: Magnesium 2.0 08/17/2018: Hemoglobin 9.1; Platelets 148 08/24/2018: BUN 22; Creatinine, Ser 0.99; Potassium 4.6; Sodium 141   Recent Lipid Panel Lab Results  Component Value Date/Time   CHOL 120 06/17/2018 04:00 AM   TRIG 117 06/17/2018 04:00 AM   HDL 25 (L) 06/17/2018 04:00 AM   CHOLHDL 4.8 06/17/2018 04:00 AM   LDLCALC 72 06/17/2018 04:00 AM    Wt Readings from Last 3 Encounters:  08/15/19 134 lb (60.8 kg)  08/24/18 134 lb 12.8 oz (61.1 kg)  08/17/18 134 lb (60.8 kg)     Objective:    Vital Signs:  Ht 5\' 3"  (1.6 m)   Wt 134 lb (60.8 kg)   BMI 23.74 kg/m    ASSESSMENT & PLAN:    Severe AS s/p TAVR: doing well with NYHA class I symptoms. She stays active doing house work and is functionally independent. She understands the need for ongoing SBE prophylaxis. Continue on aspirin  81 mg daily. She has not seen any medical providers in person since the pandemic. We discussed getting the vaccine and I told her that I highly recommend it. When she is fully vaccinated or comfortable coming into medical offices, I have asked her to get an echocardiogram. Otherwise, she should follow up with Dr. Rockey Situ in about 6 months (can be virtual).  COVID-19 Education: The signs and symptoms of COVID-19 were discussed with the patient and how to seek care for testing (follow up with PCP or arrange E-visit).  The importance of social distancing was discussed today.  Time:   Today, I have spent 11 minutes with the patient with telehealth technology discussing the above problems.     Medication Adjustments/Labs and Tests Ordered: Current medicines are reviewed at length with the patient today.  Concerns regarding medicines are outlined above.   Tests Ordered: No orders of the defined types were placed in this encounter.   Medication Changes: No orders of the defined types were placed in this encounter.    Disposition:  Follow up in 6 month(s) with Dr. Rockey Situ.   Signed, Angelena Form, PA-C  08/15/2019 2:17 PM    [facility]

## 2019-08-08 ENCOUNTER — Other Ambulatory Visit (HOSPITAL_COMMUNITY): Payer: Medicare Other

## 2019-08-15 ENCOUNTER — Other Ambulatory Visit (HOSPITAL_COMMUNITY): Payer: Medicare Other

## 2019-08-15 ENCOUNTER — Encounter: Payer: Self-pay | Admitting: Physician Assistant

## 2019-08-15 ENCOUNTER — Other Ambulatory Visit: Payer: Self-pay

## 2019-08-15 ENCOUNTER — Telehealth (INDEPENDENT_AMBULATORY_CARE_PROVIDER_SITE_OTHER): Payer: Medicare Other | Admitting: Physician Assistant

## 2019-08-15 VITALS — Ht 63.0 in | Wt 134.0 lb

## 2019-08-15 DIAGNOSIS — Z952 Presence of prosthetic heart valve: Secondary | ICD-10-CM

## 2019-09-25 DIAGNOSIS — Z961 Presence of intraocular lens: Secondary | ICD-10-CM | POA: Diagnosis not present

## 2019-09-27 DIAGNOSIS — Z952 Presence of prosthetic heart valve: Secondary | ICD-10-CM | POA: Diagnosis not present

## 2019-09-27 DIAGNOSIS — Z0000 Encounter for general adult medical examination without abnormal findings: Secondary | ICD-10-CM | POA: Diagnosis not present

## 2019-09-27 DIAGNOSIS — I110 Hypertensive heart disease with heart failure: Secondary | ICD-10-CM | POA: Diagnosis not present

## 2019-09-27 DIAGNOSIS — E1129 Type 2 diabetes mellitus with other diabetic kidney complication: Secondary | ICD-10-CM | POA: Diagnosis not present

## 2019-09-27 DIAGNOSIS — J301 Allergic rhinitis due to pollen: Secondary | ICD-10-CM | POA: Diagnosis not present

## 2019-09-27 DIAGNOSIS — E782 Mixed hyperlipidemia: Secondary | ICD-10-CM | POA: Diagnosis not present

## 2019-09-27 DIAGNOSIS — N1831 Chronic kidney disease, stage 3a: Secondary | ICD-10-CM | POA: Diagnosis not present

## 2019-09-27 DIAGNOSIS — M8580 Other specified disorders of bone density and structure, unspecified site: Secondary | ICD-10-CM | POA: Diagnosis not present

## 2019-09-27 DIAGNOSIS — I519 Heart disease, unspecified: Secondary | ICD-10-CM | POA: Diagnosis not present

## 2019-09-27 DIAGNOSIS — M1990 Unspecified osteoarthritis, unspecified site: Secondary | ICD-10-CM | POA: Diagnosis not present

## 2019-09-27 DIAGNOSIS — M549 Dorsalgia, unspecified: Secondary | ICD-10-CM | POA: Diagnosis not present

## 2019-11-02 DIAGNOSIS — Z23 Encounter for immunization: Secondary | ICD-10-CM | POA: Diagnosis not present

## 2020-04-16 ENCOUNTER — Telehealth: Payer: Self-pay | Admitting: Cardiovascular Disease

## 2020-04-16 NOTE — Telephone Encounter (Signed)
Per daughter on dpr appts are not being done unless needed at this time.    Deleting recall.

## 2020-07-13 IMAGING — CT CT ANGIO CHEST
3 of 23 series · 8 of 38 positions shown · IV contrast (iopamidol)
Comparison: No priors.

CLINICAL DATA: [AGE] female with history of severe aortic
stenosis. Preprocedural study prior to potential transcatheter
aortic valve replacement (TAVR).

EXAM:
CT ANGIOGRAPHY CHEST, ABDOMEN AND PELVIS
TECHNIQUE: Multidetector CT imaging through the chest, abdomen and pelvis was
performed using the standard protocol during bolus administration of
intravenous contrast. Multiplanar reconstructed images and MIPs were
obtained and reviewed to evaluate the vascular anatomy.
CONTRAST:  150mL GENTAM-JGH IOPAMIDOL (GENTAM-JGH) INJECTION 76%

[Series 9: 5-95% · axial · 0.39mm/px · z∈[+1171,+1234]mm · 2 of 3140 slices shown (1 of 2)]
[im 1047/3140  lung]
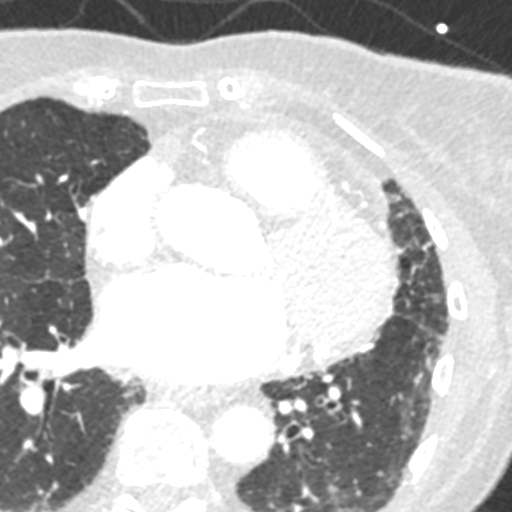
[im 2093/3140  lung]
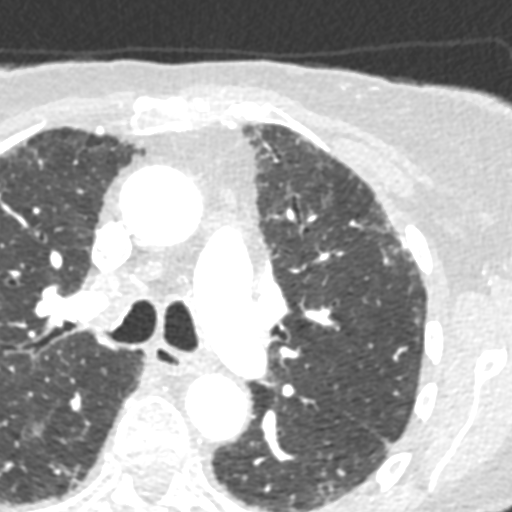

[Series 25: 0-90% · axial · 0.39mm/px · z∈[+1132,+1242]mm · 3 of 3670 slices shown]
[im 918/3670  lung]
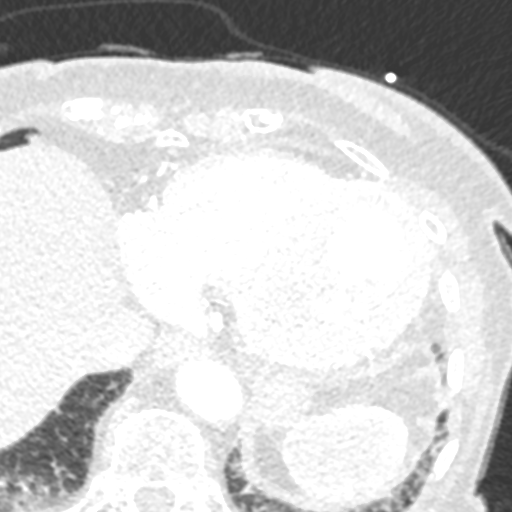
[im 1835/3670  mediastinal]
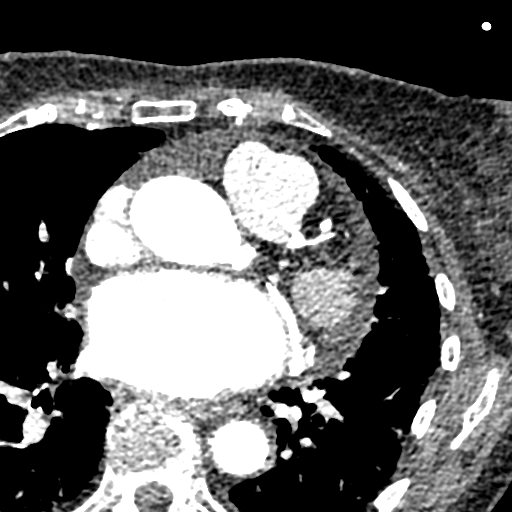
[im 2752/3670  lung]
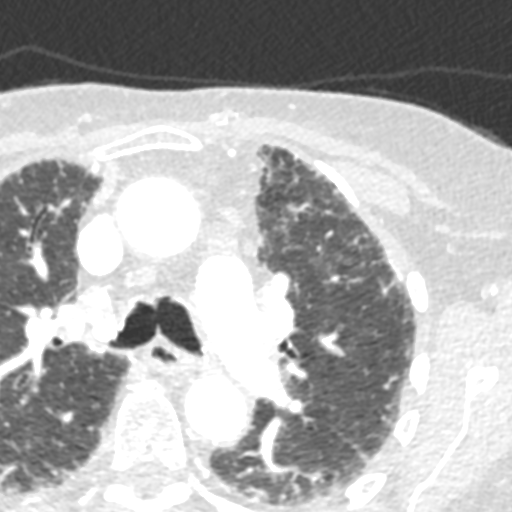

[Series 26: 5-95% · axial · 0.39mm/px · z∈[+1132,+1242]mm · 3 of 3670 slices shown (2 of 2)]
[im 918/3670  lung]
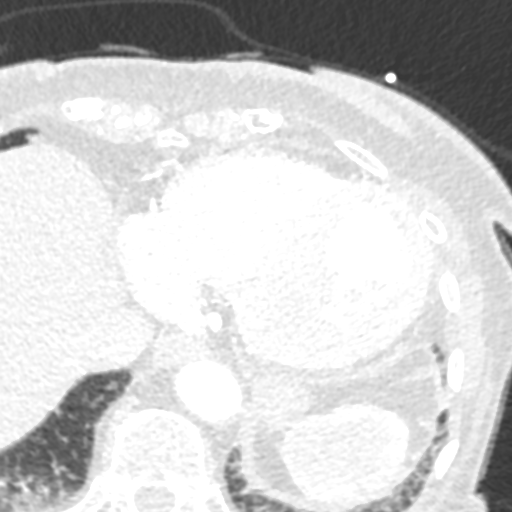
[im 1835/3670  lung]
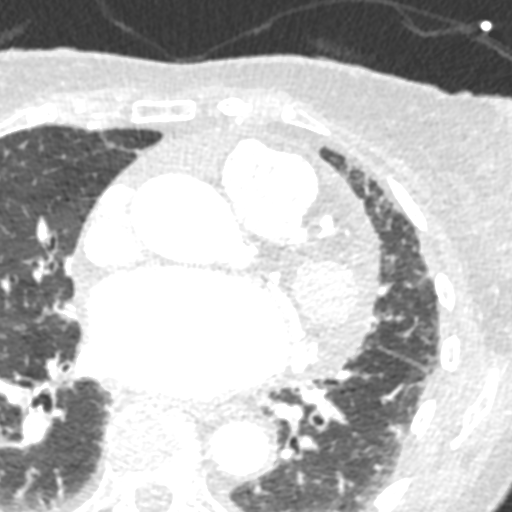
[im 2752/3670  lung]
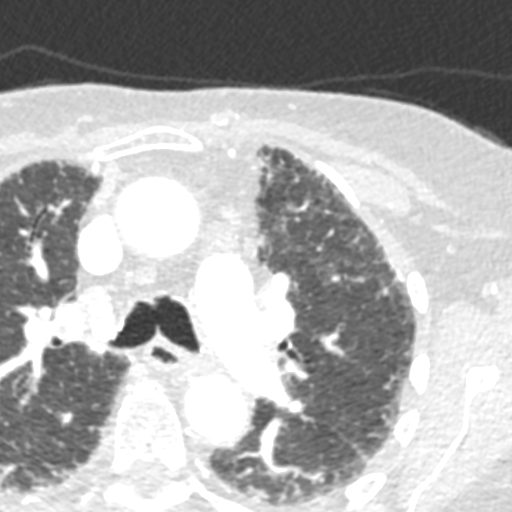

[8 of 38 positions shown; findings below may reference images not displayed]

FINDINGS: CTA CHEST FINDINGS

Cardiovascular: Heart size is normal. There is no significant
pericardial fluid, thickening or pericardial calcification. There is
aortic atherosclerosis, as well as atherosclerosis of the great
vessels of the mediastinum and the coronary arteries, including
calcified atherosclerotic plaque in the left main, left anterior
descending, left circumflex and right coronary arteries. Severe
thickening and calcification of the aortic valve. Calcifications of
the mitral annulus.

Mediastinum/Lymph Nodes: No pathologically enlarged mediastinal or
hilar lymph nodes. Esophagus is unremarkable in appearance. No
axillary lymphadenopathy.

Lungs/Pleura: Lung apices are incompletely imaged. With this
limitation in mind, there are no definite suspicious appearing
pulmonary nodules or masses noted. No acute consolidative airspace
disease. No pleural effusions. Scattered areas of mild peripheral
predominant septal thickening, most evident throughout the mid to
lower lungs. Scattered areas of very mild cylindrical
bronchiectasis.

Musculoskeletal/Soft Tissues: There are no aggressive appearing
lytic or blastic lesions noted in the visualized portions of the
skeleton.

CTA ABDOMEN AND PELVIS FINDINGS

Hepatobiliary: Small calcified granuloma in the liver. No suspicious
cystic or solid hepatic lesions. No intra or extrahepatic biliary
ductal dilatation. Gallbladder is normal in appearance.

Pancreas: No pancreatic mass. No pancreatic ductal dilatation. No
pancreatic or peripancreatic fluid or inflammatory changes.

Spleen: Unremarkable.

Adrenals/Urinary Tract: Low-attenuation lesions in both kidneys, the
largest of which is in the interpolar region of the right kidney
measuring 1.8 cm in diameter. Mild cortical thinning in both
kidneys. No suspicious renal lesions. Bilateral adrenal glands are
normal in appearance. No hydroureteronephrosis. Urinary bladder is
normal in appearance.

Stomach/Bowel: Normal appearance of the stomach. No pathologic
dilatation of small bowel or colon. Numerous colonic diverticulae
are noted, without surrounding inflammatory changes to suggest an
acute diverticulitis at this time. Normal appendix.

Vascular/Lymphatic: Aortic atherosclerosis, without evidence of
aneurysm or dissection in the abdominal or pelvic vasculature.
Vascular findings and measurements pertinent to potential TAVR
procedure, as detailed below. No lymphadenopathy noted in the
abdomen or pelvis.

Reproductive: Uterus and ovaries are atrophic.

Other: No significant volume of ascites.  No pneumoperitoneum.

Musculoskeletal: There are no aggressive appearing lytic or blastic
lesions noted in the visualized portions of the skeleton.

VASCULAR MEASUREMENTS PERTINENT TO TAVR:

AORTA:

Minimal Aortic 2iameter-E x 9 mm

Severity of Aortic Calcification-severe

RIGHT PELVIS:

Right Common Iliac Artery -

Minimal Aiameter-R.R x 6.4 mm

Tortuosity-mild

Calcification-moderate to severe

Right External Iliac Artery -

Minimal Niameter-2.N x 6.0 mm

Tortuosity - mild

Calcification-none

Right Common Femoral Artery -

Minimal Kiameter-Z.J x 5.2 mm

Tortuosity - mild

Calcification-mild

LEFT PELVIS:

Left Common Iliac Artery -

Minimal 2iameter-4.U x 7.0 mm

Tortuosity - mild

Calcification-moderate to severe

Left External Iliac Artery -

Minimal Niameter-Y.C x 6.3 mm

Tortuosity - mild

Calcification-none

Left Common Femoral Artery -

Minimal Aiameter-R.R x 5.8 mm

Tortuosity - mild

Calcification-mild

Review of the MIP images confirms the above findings.
IMPRESSION: 1. Vascular findings and measurements pertinent to potential TAVR
procedure, as detailed above.
2. Severe thickening calcification of the aortic valve, compatible
with the reported clinical history of severe aortic stenosis.
3. Aortic atherosclerosis, in addition to left main and 3 vessel
coronary artery disease.
4. Changes in the lungs which may suggest developing interstitial
lung disease. Outpatient referral to Pulmonology for further
evaluation should be considered if clinically appropriate.
5. Colonic diverticulosis without findings of acute diverticulitis
at this time.
6. Additional incidental findings, as above.

## 2020-10-01 DIAGNOSIS — D692 Other nonthrombocytopenic purpura: Secondary | ICD-10-CM | POA: Diagnosis not present

## 2020-10-01 DIAGNOSIS — E1129 Type 2 diabetes mellitus with other diabetic kidney complication: Secondary | ICD-10-CM | POA: Diagnosis not present

## 2020-10-01 DIAGNOSIS — Z0000 Encounter for general adult medical examination without abnormal findings: Secondary | ICD-10-CM | POA: Diagnosis not present

## 2020-10-01 DIAGNOSIS — M1990 Unspecified osteoarthritis, unspecified site: Secondary | ICD-10-CM | POA: Diagnosis not present

## 2020-10-01 DIAGNOSIS — M549 Dorsalgia, unspecified: Secondary | ICD-10-CM | POA: Diagnosis not present

## 2020-10-01 DIAGNOSIS — I110 Hypertensive heart disease with heart failure: Secondary | ICD-10-CM | POA: Diagnosis not present

## 2020-10-01 DIAGNOSIS — N183 Chronic kidney disease, stage 3 unspecified: Secondary | ICD-10-CM | POA: Diagnosis not present

## 2020-10-01 DIAGNOSIS — Z952 Presence of prosthetic heart valve: Secondary | ICD-10-CM | POA: Diagnosis not present

## 2020-10-01 DIAGNOSIS — E782 Mixed hyperlipidemia: Secondary | ICD-10-CM | POA: Diagnosis not present

## 2020-10-01 DIAGNOSIS — F05 Delirium due to known physiological condition: Secondary | ICD-10-CM | POA: Diagnosis not present

## 2020-10-01 DIAGNOSIS — I519 Heart disease, unspecified: Secondary | ICD-10-CM | POA: Diagnosis not present

## 2020-10-01 DIAGNOSIS — M8580 Other specified disorders of bone density and structure, unspecified site: Secondary | ICD-10-CM | POA: Diagnosis not present

## 2021-03-19 DIAGNOSIS — Z23 Encounter for immunization: Secondary | ICD-10-CM | POA: Diagnosis not present

## 2021-10-07 DIAGNOSIS — E1129 Type 2 diabetes mellitus with other diabetic kidney complication: Secondary | ICD-10-CM | POA: Diagnosis not present

## 2021-10-07 DIAGNOSIS — E782 Mixed hyperlipidemia: Secondary | ICD-10-CM | POA: Diagnosis not present

## 2021-10-07 DIAGNOSIS — Z0000 Encounter for general adult medical examination without abnormal findings: Secondary | ICD-10-CM | POA: Diagnosis not present

## 2021-10-07 DIAGNOSIS — I519 Heart disease, unspecified: Secondary | ICD-10-CM | POA: Diagnosis not present

## 2021-10-07 DIAGNOSIS — I110 Hypertensive heart disease with heart failure: Secondary | ICD-10-CM | POA: Diagnosis not present

## 2021-10-07 DIAGNOSIS — F05 Delirium due to known physiological condition: Secondary | ICD-10-CM | POA: Diagnosis not present

## 2021-10-07 DIAGNOSIS — N183 Chronic kidney disease, stage 3 unspecified: Secondary | ICD-10-CM | POA: Diagnosis not present

## 2021-10-07 DIAGNOSIS — M8580 Other specified disorders of bone density and structure, unspecified site: Secondary | ICD-10-CM | POA: Diagnosis not present

## 2021-10-07 DIAGNOSIS — J301 Allergic rhinitis due to pollen: Secondary | ICD-10-CM | POA: Diagnosis not present

## 2021-10-07 DIAGNOSIS — Z952 Presence of prosthetic heart valve: Secondary | ICD-10-CM | POA: Diagnosis not present

## 2021-10-07 DIAGNOSIS — M1990 Unspecified osteoarthritis, unspecified site: Secondary | ICD-10-CM | POA: Diagnosis not present

## 2021-10-07 DIAGNOSIS — D692 Other nonthrombocytopenic purpura: Secondary | ICD-10-CM | POA: Diagnosis not present

## 2021-10-21 DIAGNOSIS — Z20822 Contact with and (suspected) exposure to covid-19: Secondary | ICD-10-CM | POA: Diagnosis not present

## 2022-01-20 DIAGNOSIS — Z961 Presence of intraocular lens: Secondary | ICD-10-CM | POA: Diagnosis not present

## 2022-10-13 DIAGNOSIS — I519 Heart disease, unspecified: Secondary | ICD-10-CM | POA: Diagnosis not present

## 2022-10-13 DIAGNOSIS — M8580 Other specified disorders of bone density and structure, unspecified site: Secondary | ICD-10-CM | POA: Diagnosis not present

## 2022-10-13 DIAGNOSIS — J301 Allergic rhinitis due to pollen: Secondary | ICD-10-CM | POA: Diagnosis not present

## 2022-10-13 DIAGNOSIS — Z0000 Encounter for general adult medical examination without abnormal findings: Secondary | ICD-10-CM | POA: Diagnosis not present

## 2022-10-13 DIAGNOSIS — Z952 Presence of prosthetic heart valve: Secondary | ICD-10-CM | POA: Diagnosis not present

## 2022-10-13 DIAGNOSIS — I110 Hypertensive heart disease with heart failure: Secondary | ICD-10-CM | POA: Diagnosis not present

## 2022-10-13 DIAGNOSIS — M1990 Unspecified osteoarthritis, unspecified site: Secondary | ICD-10-CM | POA: Diagnosis not present

## 2022-10-13 DIAGNOSIS — E1129 Type 2 diabetes mellitus with other diabetic kidney complication: Secondary | ICD-10-CM | POA: Diagnosis not present

## 2022-10-13 DIAGNOSIS — E782 Mixed hyperlipidemia: Secondary | ICD-10-CM | POA: Diagnosis not present

## 2022-10-13 DIAGNOSIS — F05 Delirium due to known physiological condition: Secondary | ICD-10-CM | POA: Diagnosis not present

## 2022-10-13 DIAGNOSIS — N183 Chronic kidney disease, stage 3 unspecified: Secondary | ICD-10-CM | POA: Diagnosis not present

## 2022-10-13 DIAGNOSIS — D692 Other nonthrombocytopenic purpura: Secondary | ICD-10-CM | POA: Diagnosis not present

## 2022-10-24 DIAGNOSIS — R404 Transient alteration of awareness: Secondary | ICD-10-CM | POA: Diagnosis not present

## 2022-11-20 DIAGNOSIS — 419620001 Death: Secondary | SNOMED CT | POA: Diagnosis not present

## 2022-11-20 DEATH — deceased
